# Patient Record
Sex: Female | Born: 1965 | Race: White | Hispanic: Yes | Marital: Married | Smoking: Never smoker
Health system: Southern US, Community
[De-identification: ages and names within clinical notes are randomized; demographics above are authoritative.]

## PROBLEM LIST (undated history)

## (undated) DIAGNOSIS — I10 Essential (primary) hypertension: Secondary | ICD-10-CM

## (undated) DIAGNOSIS — Z6791 Unspecified blood type, Rh negative: Secondary | ICD-10-CM

## (undated) DIAGNOSIS — E039 Hypothyroidism, unspecified: Secondary | ICD-10-CM

## (undated) DIAGNOSIS — E119 Type 2 diabetes mellitus without complications: Secondary | ICD-10-CM

## (undated) DIAGNOSIS — K219 Gastro-esophageal reflux disease without esophagitis: Secondary | ICD-10-CM

## (undated) DIAGNOSIS — E079 Disorder of thyroid, unspecified: Secondary | ICD-10-CM

## (undated) DIAGNOSIS — E7800 Pure hypercholesterolemia, unspecified: Secondary | ICD-10-CM

## (undated) DIAGNOSIS — B009 Herpesviral infection, unspecified: Secondary | ICD-10-CM

## (undated) HISTORY — PX: TUBAL LIGATION: SHX77

## (undated) HISTORY — DX: Herpesviral infection, unspecified: B00.9

## (undated) HISTORY — DX: Disorder of thyroid, unspecified: E07.9

## (undated) HISTORY — DX: Unspecified blood type, rh negative: Z67.91

## (undated) HISTORY — PX: DIAGNOSTIC LAPAROSCOPY: SUR761

---

## 2001-09-14 ENCOUNTER — Encounter: Admission: RE | Admit: 2001-09-14 | Discharge: 2001-09-14 | Payer: Self-pay | Admitting: Family Medicine

## 2001-09-23 ENCOUNTER — Encounter (INDEPENDENT_AMBULATORY_CARE_PROVIDER_SITE_OTHER): Payer: Self-pay | Admitting: *Deleted

## 2001-09-23 LAB — CONVERTED CEMR LAB

## 2001-09-24 ENCOUNTER — Encounter: Admission: RE | Admit: 2001-09-24 | Discharge: 2001-09-24 | Payer: Self-pay | Admitting: Family Medicine

## 2001-10-06 ENCOUNTER — Ambulatory Visit (HOSPITAL_COMMUNITY): Admission: RE | Admit: 2001-10-06 | Discharge: 2001-10-06 | Payer: Self-pay | Admitting: Family Medicine

## 2001-10-11 ENCOUNTER — Encounter: Admission: RE | Admit: 2001-10-11 | Discharge: 2001-10-11 | Payer: Self-pay | Admitting: Family Medicine

## 2001-10-25 ENCOUNTER — Encounter: Admission: RE | Admit: 2001-10-25 | Discharge: 2001-10-25 | Payer: Self-pay | Admitting: Family Medicine

## 2001-10-27 ENCOUNTER — Encounter: Admission: RE | Admit: 2001-10-27 | Discharge: 2001-10-27 | Payer: Self-pay | Admitting: Psychology

## 2001-11-11 ENCOUNTER — Encounter: Admission: RE | Admit: 2001-11-11 | Discharge: 2001-11-11 | Payer: Self-pay | Admitting: Family Medicine

## 2001-11-30 ENCOUNTER — Encounter: Admission: RE | Admit: 2001-11-30 | Discharge: 2001-11-30 | Payer: Self-pay | Admitting: Family Medicine

## 2001-12-06 ENCOUNTER — Encounter: Admission: RE | Admit: 2001-12-06 | Discharge: 2001-12-06 | Payer: Self-pay | Admitting: Family Medicine

## 2001-12-31 ENCOUNTER — Encounter: Admission: RE | Admit: 2001-12-31 | Discharge: 2001-12-31 | Payer: Self-pay | Admitting: Family Medicine

## 2002-01-18 ENCOUNTER — Encounter: Admission: RE | Admit: 2002-01-18 | Discharge: 2002-01-18 | Payer: Self-pay | Admitting: Family Medicine

## 2002-02-04 ENCOUNTER — Encounter: Admission: RE | Admit: 2002-02-04 | Discharge: 2002-02-04 | Payer: Self-pay | Admitting: Family Medicine

## 2002-02-11 ENCOUNTER — Encounter: Admission: RE | Admit: 2002-02-11 | Discharge: 2002-02-11 | Payer: Self-pay | Admitting: Family Medicine

## 2002-02-11 ENCOUNTER — Ambulatory Visit (HOSPITAL_COMMUNITY): Admission: RE | Admit: 2002-02-11 | Discharge: 2002-02-11 | Payer: Self-pay

## 2002-02-16 ENCOUNTER — Encounter: Admission: RE | Admit: 2002-02-16 | Discharge: 2002-02-16 | Payer: Self-pay | Admitting: Family Medicine

## 2002-02-18 ENCOUNTER — Encounter (HOSPITAL_COMMUNITY): Admission: RE | Admit: 2002-02-18 | Discharge: 2002-02-18 | Payer: Self-pay | Admitting: *Deleted

## 2002-02-22 ENCOUNTER — Encounter (INDEPENDENT_AMBULATORY_CARE_PROVIDER_SITE_OTHER): Payer: Self-pay | Admitting: Specialist

## 2002-02-22 ENCOUNTER — Inpatient Hospital Stay (HOSPITAL_COMMUNITY): Admission: AD | Admit: 2002-02-22 | Discharge: 2002-02-25 | Payer: Self-pay | Admitting: *Deleted

## 2002-04-14 ENCOUNTER — Encounter: Admission: RE | Admit: 2002-04-14 | Discharge: 2002-04-14 | Payer: Self-pay | Admitting: Family Medicine

## 2003-10-27 ENCOUNTER — Ambulatory Visit (HOSPITAL_COMMUNITY): Admission: RE | Admit: 2003-10-27 | Discharge: 2003-10-27 | Payer: Self-pay | Admitting: Family Medicine

## 2006-07-24 ENCOUNTER — Encounter (INDEPENDENT_AMBULATORY_CARE_PROVIDER_SITE_OTHER): Payer: Self-pay | Admitting: *Deleted

## 2007-09-07 ENCOUNTER — Ambulatory Visit (HOSPITAL_COMMUNITY): Admission: RE | Admit: 2007-09-07 | Discharge: 2007-09-07 | Payer: Self-pay | Admitting: Obstetrics & Gynecology

## 2008-03-22 ENCOUNTER — Ambulatory Visit: Payer: Self-pay | Admitting: Family Medicine

## 2008-03-28 ENCOUNTER — Ambulatory Visit: Payer: Self-pay | Admitting: *Deleted

## 2008-04-25 ENCOUNTER — Ambulatory Visit: Payer: Self-pay | Admitting: Internal Medicine

## 2008-04-25 ENCOUNTER — Encounter: Payer: Self-pay | Admitting: Family Medicine

## 2008-04-25 LAB — CONVERTED CEMR LAB
ALT: 22 units/L (ref 0–35)
AST: 22 units/L (ref 0–37)
Albumin: 3.6 g/dL (ref 3.5–5.2)
Alkaline Phosphatase: 132 units/L — ABNORMAL HIGH (ref 39–117)
BUN: 20 mg/dL (ref 6–23)
Basophils Absolute: 0 10*3/uL (ref 0.0–0.1)
Basophils Relative: 0 % (ref 0–1)
CO2: 21 meq/L (ref 19–32)
Calcium: 9.4 mg/dL (ref 8.4–10.5)
Chloride: 108 meq/L (ref 96–112)
Cholesterol: 133 mg/dL (ref 0–200)
Creatinine, Ser: 0.35 mg/dL — ABNORMAL LOW (ref 0.40–1.20)
Eosinophils Absolute: 0.1 10*3/uL (ref 0.0–0.7)
Eosinophils Relative: 1 % (ref 0–5)
Glucose, Bld: 88 mg/dL (ref 70–99)
HCT: 34.1 % — ABNORMAL LOW (ref 36.0–46.0)
HDL: 50 mg/dL (ref >39)
Hemoglobin: 11.9 g/dL — ABNORMAL LOW (ref 12.0–15.0)
LDL Cholesterol: 72 mg/dL (ref 0–99)
Lymphocytes Relative: 41 % (ref 12–46)
Lymphs Abs: 2.6 10*3/uL (ref 0.7–4.0)
MCHC: 34.9 g/dL (ref 30.0–36.0)
MCV: 83.4 fL (ref 78.0–100.0)
Monocytes Absolute: 0.6 10*3/uL (ref 0.1–1.0)
Monocytes Relative: 9 % (ref 3–12)
Neutro Abs: 3.1 10*3/uL (ref 1.7–7.7)
Neutrophils Relative %: 49 % (ref 43–77)
Platelets: 180 10*3/uL (ref 150–400)
Potassium: 3.7 meq/L (ref 3.5–5.3)
RBC: 4.09 M/uL (ref 3.87–5.11)
RDW: 13.5 % (ref 11.5–15.5)
Sodium: 140 meq/L (ref 135–145)
TSH: 0.007 microintl units/mL — ABNORMAL LOW (ref 0.350–4.50)
Total Bilirubin: 1.8 mg/dL — ABNORMAL HIGH (ref 0.3–1.2)
Total CHOL/HDL Ratio: 2.7
Total Protein: 6.3 g/dL (ref 6.0–8.3)
Triglycerides: 53 mg/dL (ref <150)
VLDL: 11 mg/dL (ref 0–40)
WBC: 6.3 10*3/uL (ref 4.0–10.5)

## 2008-05-01 ENCOUNTER — Ambulatory Visit: Payer: Self-pay | Admitting: Family Medicine

## 2008-05-01 ENCOUNTER — Encounter: Payer: Self-pay | Admitting: Family Medicine

## 2008-05-01 LAB — CONVERTED CEMR LAB
Chlamydia, DNA Probe: NEGATIVE
Free T4: 5.98 ng/dL — ABNORMAL HIGH (ref 0.89–1.80)
GC Probe Amp, Genital: NEGATIVE
TSH: 0.005 microintl units/mL — ABNORMAL LOW (ref 0.350–4.50)

## 2008-05-17 ENCOUNTER — Encounter: Payer: Self-pay | Admitting: Family Medicine

## 2008-05-17 ENCOUNTER — Ambulatory Visit: Payer: Self-pay | Admitting: Internal Medicine

## 2008-05-17 LAB — CONVERTED CEMR LAB
Free T4: 2.93 ng/dL — ABNORMAL HIGH (ref 0.89–1.80)
TSH: <0.004 microintl units/mL — ABNORMAL LOW (ref 0.350–4.50)

## 2008-05-29 ENCOUNTER — Encounter (HOSPITAL_COMMUNITY): Admission: RE | Admit: 2008-05-29 | Discharge: 2008-05-30 | Payer: Self-pay | Admitting: Family Medicine

## 2008-09-29 ENCOUNTER — Emergency Department (HOSPITAL_COMMUNITY): Admission: EM | Admit: 2008-09-29 | Discharge: 2008-09-29 | Payer: Self-pay | Admitting: Family Medicine

## 2008-11-16 ENCOUNTER — Ambulatory Visit: Payer: Self-pay | Admitting: Internal Medicine

## 2008-11-24 ENCOUNTER — Ambulatory Visit: Payer: Self-pay | Admitting: Internal Medicine

## 2009-01-04 ENCOUNTER — Ambulatory Visit: Payer: Self-pay | Admitting: Internal Medicine

## 2009-01-04 LAB — CONVERTED CEMR LAB
Free T4: 0.9 ng/dL (ref 0.80–1.80)
TSH: 4.854 microintl units/mL — ABNORMAL HIGH (ref 0.350–4.500)

## 2009-01-31 ENCOUNTER — Ambulatory Visit: Payer: Self-pay | Admitting: Internal Medicine

## 2009-02-01 ENCOUNTER — Encounter: Payer: Self-pay | Admitting: Family Medicine

## 2009-02-01 LAB — CONVERTED CEMR LAB
Cholesterol: 222 mg/dL — ABNORMAL HIGH (ref 0–200)
HDL: 64 mg/dL (ref >39)
LDL Cholesterol: 137 mg/dL — ABNORMAL HIGH (ref 0–99)
TSH: 4.697 microintl units/mL — ABNORMAL HIGH (ref 0.350–4.500)
Total CHOL/HDL Ratio: 3.5
Triglycerides: 105 mg/dL (ref <150)
VLDL: 21 mg/dL (ref 0–40)

## 2009-04-09 ENCOUNTER — Encounter: Payer: Self-pay | Admitting: Family Medicine

## 2009-04-09 ENCOUNTER — Ambulatory Visit: Payer: Self-pay | Admitting: Internal Medicine

## 2009-04-09 LAB — CONVERTED CEMR LAB: TSH: 0.217 microintl units/mL — ABNORMAL LOW (ref 0.350–4.500)

## 2009-04-16 ENCOUNTER — Ambulatory Visit: Payer: Self-pay | Admitting: Internal Medicine

## 2009-06-07 ENCOUNTER — Ambulatory Visit: Payer: Self-pay | Admitting: Internal Medicine

## 2009-06-07 LAB — CONVERTED CEMR LAB: TSH: 0.506 microintl units/mL (ref 0.350–4.500)

## 2009-06-15 ENCOUNTER — Ambulatory Visit: Payer: Self-pay | Admitting: Internal Medicine

## 2009-06-15 LAB — CONVERTED CEMR LAB: TSH: 0.493 microintl units/mL (ref 0.350–4.500)

## 2009-07-19 ENCOUNTER — Ambulatory Visit: Payer: Self-pay | Admitting: Internal Medicine

## 2009-07-19 LAB — CONVERTED CEMR LAB
Chlamydia, DNA Probe: NEGATIVE
GC Probe Amp, Genital: NEGATIVE
Pap Smear: NEGATIVE

## 2009-11-14 ENCOUNTER — Ambulatory Visit: Payer: Self-pay | Admitting: Internal Medicine

## 2009-11-14 LAB — CONVERTED CEMR LAB
BUN: 12 mg/dL (ref 6–23)
CO2: 23 meq/L (ref 19–32)
Calcium: 8.9 mg/dL (ref 8.4–10.5)
Chloride: 106 meq/L (ref 96–112)
Creatinine, Ser: 0.68 mg/dL (ref 0.40–1.20)
Glucose, Bld: 91 mg/dL (ref 70–99)
Magnesium: 1.7 mg/dL (ref 1.5–2.5)
Potassium: 4.1 meq/L (ref 3.5–5.3)
Sodium: 138 meq/L (ref 135–145)
TSH: 8.367 microintl units/mL — ABNORMAL HIGH (ref 0.350–4.500)

## 2010-01-21 ENCOUNTER — Ambulatory Visit: Payer: Self-pay | Admitting: Internal Medicine

## 2010-01-21 LAB — CONVERTED CEMR LAB: TSH: 9.836 microintl units/mL — ABNORMAL HIGH (ref 0.350–4.500)

## 2010-03-20 ENCOUNTER — Ambulatory Visit: Payer: Self-pay | Admitting: Family Medicine

## 2010-03-20 ENCOUNTER — Encounter: Payer: Self-pay | Admitting: Family Medicine

## 2010-03-20 LAB — CONVERTED CEMR LAB
Antibody Screen: NEGATIVE
Basophils Absolute: 0 10*3/uL (ref 0.0–0.1)
Basophils Relative: 0 % (ref 0–1)
Eosinophils Absolute: 0 10*3/uL (ref 0.0–0.7)
Eosinophils Relative: 0 % (ref 0–5)
HCT: 34.8 % — ABNORMAL LOW (ref 36.0–46.0)
HIV: NONREACTIVE
Hemoglobin: 11.6 g/dL — ABNORMAL LOW (ref 12.0–15.0)
Hepatitis B Surface Ag: NEGATIVE
Lymphocytes Relative: 10 % — ABNORMAL LOW (ref 12–46)
Lymphs Abs: 1.1 10*3/uL (ref 0.7–4.0)
MCHC: 33.3 g/dL (ref 30.0–36.0)
MCV: 83.5 fL (ref 78.0–100.0)
Monocytes Absolute: 0.5 10*3/uL (ref 0.1–1.0)
Monocytes Relative: 5 % (ref 3–12)
Neutro Abs: 8.9 10*3/uL — ABNORMAL HIGH (ref 1.7–7.7)
Neutrophils Relative %: 85 % — ABNORMAL HIGH (ref 43–77)
Platelets: 276 10*3/uL (ref 150–400)
RBC: 4.17 M/uL (ref 3.87–5.11)
RDW: 22.5 % — ABNORMAL HIGH (ref 11.5–15.5)
Rh Type: NEGATIVE
Rubella: 14.5 intl units/mL — ABNORMAL HIGH
Sickle Cell Screen: NEGATIVE
WBC: 10.5 10*3/uL (ref 4.0–10.5)

## 2010-03-28 ENCOUNTER — Ambulatory Visit: Payer: Self-pay | Admitting: Family Medicine

## 2010-03-28 ENCOUNTER — Encounter: Payer: Self-pay | Admitting: Family Medicine

## 2010-03-28 DIAGNOSIS — A6000 Herpesviral infection of urogenital system, unspecified: Secondary | ICD-10-CM | POA: Insufficient documentation

## 2010-03-28 DIAGNOSIS — E039 Hypothyroidism, unspecified: Secondary | ICD-10-CM | POA: Insufficient documentation

## 2010-03-28 LAB — CONVERTED CEMR LAB
Glucose, Urine, Semiquant: NEGATIVE
Nitrite: NEGATIVE
Protein, U semiquant: NEGATIVE

## 2010-04-25 ENCOUNTER — Telehealth: Payer: Self-pay | Admitting: *Deleted

## 2010-05-09 ENCOUNTER — Encounter: Payer: Self-pay | Admitting: Family Medicine

## 2010-05-09 ENCOUNTER — Ambulatory Visit: Payer: Self-pay | Admitting: Obstetrics & Gynecology

## 2010-05-09 ENCOUNTER — Encounter (INDEPENDENT_AMBULATORY_CARE_PROVIDER_SITE_OTHER): Payer: Self-pay | Admitting: *Deleted

## 2010-05-09 LAB — CONVERTED CEMR LAB
Chlamydia, DNA Probe: NEGATIVE
GC Probe Amp, Genital: NEGATIVE

## 2010-05-10 ENCOUNTER — Ambulatory Visit (HOSPITAL_COMMUNITY)
Admission: RE | Admit: 2010-05-10 | Discharge: 2010-05-10 | Payer: Self-pay | Source: Home / Self Care | Attending: Family Medicine | Admitting: Family Medicine

## 2010-05-23 ENCOUNTER — Encounter: Payer: Self-pay | Admitting: Family Medicine

## 2010-05-23 ENCOUNTER — Ambulatory Visit: Payer: Self-pay | Admitting: Family Medicine

## 2010-05-23 LAB — CONVERTED CEMR LAB: TSH: 9.597 microintl units/mL — ABNORMAL HIGH (ref 0.350–4.500)

## 2010-05-28 ENCOUNTER — Telehealth: Payer: Self-pay | Admitting: Family Medicine

## 2010-06-06 ENCOUNTER — Encounter: Payer: Self-pay | Admitting: Family Medicine

## 2010-06-06 ENCOUNTER — Ambulatory Visit: Admission: RE | Admit: 2010-06-06 | Discharge: 2010-06-06 | Payer: Self-pay | Source: Home / Self Care

## 2010-06-06 LAB — CONVERTED CEMR LAB
Antibody Screen: NEGATIVE
HCT: 31.9 % — ABNORMAL LOW (ref 36.0–46.0)
HIV: NONREACTIVE
Hemoglobin: 11.1 g/dL — ABNORMAL LOW (ref 12.0–15.0)
MCHC: 34.8 g/dL (ref 30.0–36.0)
MCV: 95.8 fL (ref 78.0–100.0)
Platelets: 224 10*3/uL (ref 150–400)
RBC: 3.33 M/uL — ABNORMAL LOW (ref 3.87–5.11)
RDW: 14.9 % (ref 11.5–15.5)
WBC: 8.6 10*3/uL (ref 4.0–10.5)

## 2010-06-07 ENCOUNTER — Ambulatory Visit
Admission: RE | Admit: 2010-06-07 | Discharge: 2010-06-07 | Payer: Self-pay | Source: Home / Self Care | Attending: Family Medicine | Admitting: Family Medicine

## 2010-06-07 ENCOUNTER — Encounter: Payer: Self-pay | Admitting: Family Medicine

## 2010-06-10 LAB — GLUCOSE, CAPILLARY
Glucose-Capillary: 138 mg/dL — ABNORMAL HIGH (ref 70–99)
Glucose-Capillary: 90 mg/dL (ref 70–99)

## 2010-06-19 ENCOUNTER — Encounter: Payer: Self-pay | Admitting: Family Medicine

## 2010-06-19 ENCOUNTER — Ambulatory Visit: Admission: RE | Admit: 2010-06-19 | Discharge: 2010-06-19 | Payer: Self-pay | Source: Home / Self Care

## 2010-06-19 LAB — CONVERTED CEMR LAB: TSH: 4.084 microintl units/mL (ref 0.350–4.500)

## 2010-06-25 NOTE — Assessment & Plan Note (Signed)
Summary: NOB/AAM/DSL   Vital Signs:  Patient profile:   45 year old female LMP:     11/24/2009 Weight:      178 pounds Temp:     98.1 degrees F oral Pulse rate:   68 / minute BP sitting:   145 / 80  (left arm) Cuff size:   regular  Vitals Entered By: Jimmy Footman, CMA (March 28, 2010 3:06 PM) CC: OB  Is Patient Diabetic? No Pain Assessment Patient in pain? no      LMP (date): 11/24/2009     Enter LMP: 11/24/2009 Last PAP Result NEGATIVE FOR INTRAEPITHELIAL LESIONS OR MALIGNANCY.   CC:  OB .  History of Present Illness: Pt. has no current complaints, this is her 6th pregnancy, however the last one was 8 years ago.  Her LMP was7/06/2009, making her [facility] 08/30/2009.   Pt. is concerned about being on medication while pregnant, as she has been diagnosed with graves disease, and is not on synthroid since her last pregnancy.  She has also been diagnosed with HSV and takes valtrex as needed for outbreaks.  She denies any current symptoms.   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 One By Mouth Daily. 2)  Gnp Prenatal Vitamins 28-0.8 Mg Tabs (Prenatal Vit-Fe Fumarate-Fa) .... One By Mouth Daily. 3)  Valtrex 500 Mg Tabs (Valacyclovir Hcl) .... One By Mouth Two Times A Day For Outbreaks.  Allergies (verified): No Known Drug Allergies  Past History:  Past Medical History: Last updated: 07/23/2006 advanced maternal age / RH negative  Social History: Smoking Status:  never Packs/Day:  n/a  Physical Exam  General:  Well-developed,well-nourished,in no acute distress; alert,appropriate and cooperative  throughout examination Head:  Normocephalic and atraumatic without obvious abnormalities. No apparent alopecia or balding. Eyes:  No corneal or conjunctival inflammation noted. EOMI. Perrla. Mouth:  Oral mucosa and oropharynx without lesions or exudates.   Lungs:  Normal respiratory effort, chest expands symmetrically. Lungs are clear to auscultation, no crackles or wheezes. Heart:  Normal rate and regular rhythm. S1 and S2 normal without gallop, murmur, click, rub or other extra sounds. Abdomen:  Gravid. Fundal height 18 cm.  Genitalia:  Normal introitus for age, no external lesions, no vaginal discharge, mucosa pink and moist, no vaginal or cervical lesions, no vaginal atrophy, no friaility or hemorrhage. Pulses:  R and L carotid,radial,dorsalis pedis and posterior tibial pulses are full and equal bilaterally Extremities:  No clubbing, cyanosis, edema, or deformity noted with normal full range of motion of all joints.   Skin:  Intact without suspicious lesions or rashes   Impression & Recommendations:  Problem # 1:  SUPERVISION OF OTHER NORMAL PREGNANCY (ICD-V22.1) Appears to be normal pregnacy.  However, due to hypothyroidism and AMA, will refer to High-Risk clinic for evaluation.  Orders: Other OB visit- [facility] (OBCK)  Problem # 2:  ADVANCED MATERNAL AGE (ICD-659.60) Pt. will need to be evaluated by High-Risk OB clinic due to [facility].  Orders: Obstetric Referral (Obstetric) Other OB visit- [facility] (OBCK)  Problem # 3:  UNSPECIFIED HYPOTHYROIDISM (ICD-244.9) [facility]  She seems to have poor understanding/compliance  with medication changes.  Her updated medication list for this problem includes:    Levothroid 88 Mcg Tabs (Levothyroxine sodium) ..... One by mouth daily.  Orders: Obstetric Referral (Obstetric) Other OB visit- [facility] (OBCK)  Problem # 4:  UNSPECIFIED GENITAL HERPES (ICD-054.10) No current lesions.  Discussed with pt. that she would likely  need to take medication for this after 36 weeks, but would be allowed to have a vaginal delivery as long as she has no active lesions.  Orders: Obstetric Referral (Obstetric) Other OB visit- [facility] (OBCK)  Complete Medication List: 1)  Levothroid 88 Mcg Tabs (Levothyroxine sodium) .... One by mouth daily. 2)  Gnp Prenatal Vitamins 28-0.8 Mg Tabs (Prenatal vit-fe fumarate-fa) .... One by mouth daily. 3)  Valtrex 500 Mg Tabs (Valacyclovir hcl) .... One by mouth two times a day for outbreaks.  Patient Instructions: 1)  It was nice to meet you.   2)  Please keep taking your prenatal vitamin and thyroid medication.  3)  I am referring you to the High-risk pregnancy clinic at [facility], they will call you to schedule an appointment.    Orders Added: 1)  Obstetric Referral [Obstetric] 2)  Other OB visit- [facility] [OBCK]     Flowsheet View for Follow-up Visit    Weight:     178    Blood pressure:   145 / 80    Urine protein:       N    Urine glucose:    N    Urine nitrite:     N    Hx headache?     No    Nausea/vomiting?   No    Edema?     0    Bleeding?     no    Leakage/discharge?   no    Fetal activity:       N/A    Labor symptoms?   no    Fundal height:      18cm    FHR:       150    Taking Vitamins?   Y    Smoking PPD:   n/a   Past Pregnancy History    Gravida:     6    Term Births:     5    Premature Births:   0    Living Children:   5    Para:       5    Mult. Births:     0    Prev C-Section:   0    Prev. VBAC attempt?   none    Aborta:     0    Elect. Ab:     0    Spont. Ab:     0    Ectopics:     0

## 2010-06-25 NOTE — Miscellaneous (Signed)
  Clinical Lists Changes  Problems: Added new problem of UNSPECIFIED HYPOTHYROIDISM (ICD-244.9) Medications: Added new medication of * LEVOTHYROXIN unknown dosage Observations: Added new observation of REGULAREXERC: yes (03/28/2010 12:54) Added new observation of TRANSPORTACC: Public Transportation (03/28/2010 12:54) Added new observation of PT OCCUPAT: None (03/28/2010 12:54) Added new observation of ALCOHOLHX: None (03/28/2010 12:54) Added new observation of EDUCA LEVEL: 5-8 yrs (03/28/2010 12:54) Added new observation of ETHNICITY: Hispanic/Latino White (03/28/2010 12:54) Added new observation of DRUG USE: never (03/28/2010 12:54) Added new observation of SH SEXUAL: currently monogamous (03/28/2010 12:54)         Social History: Sexual History:  currently monogamous Drug Use:  never Ethnicity:  Hispanic/Latino White Education:  5-8 yrs Alcohol:  None Occupation:  None Transportation:  Therapist, music Does Patient Exercise:  yes

## 2010-06-26 ENCOUNTER — Encounter: Payer: Self-pay | Admitting: Family Medicine

## 2010-06-26 DIAGNOSIS — O26899 Other specified pregnancy related conditions, unspecified trimester: Secondary | ICD-10-CM | POA: Insufficient documentation

## 2010-06-26 DIAGNOSIS — Z6791 Unspecified blood type, Rh negative: Secondary | ICD-10-CM | POA: Insufficient documentation

## 2010-06-26 HISTORY — DX: Unspecified blood type, rh negative: Z67.91

## 2010-06-27 ENCOUNTER — Other Ambulatory Visit: Payer: Self-pay | Admitting: Family Medicine

## 2010-06-27 DIAGNOSIS — D259 Leiomyoma of uterus, unspecified: Secondary | ICD-10-CM

## 2010-06-27 NOTE — Assessment & Plan Note (Addendum)
Summary: Jamie [name]   Vital Signs:  Patient profile:   45 year old female Weight:      185 pounds BP sitting:   109 / 69  Primary Care Provider:  Ardyth Gal  CC:  follow pregnancy .  History of Present Illness: 1) Hypothryoid: taking new dose levothyroxine as prescribed w/o negative side effects.   Habits & Providers  Alcohol-Tobacco-Diet     Cigarette Packs/Day: n/a  Allergies: No Known Drug Allergies   Impression & Recommendations:  Problem # 1:  SUPERVISION OF OTHER NORMAL PREGNANCY (ICD-V22.1) Assessment Comment Only Doing well today, however overall concerned about her uncontrolled hypothyroidism during this pregnancy (see assessment below). Rhogam given today for Rh negative. Checking 1 hr GTT today - ELEVATED - will have 3 hr GTT tomorrow. Recheck CBC, RPR, HIV.  Follow up with PCP in 2 weeks. Advanced materanal age - did not get a chance to discuss referral to MFM - would have PCP do this at next appointment.    Orders: CBC-[facility] (16109) HIV-[facility] (60454-09811) Glucose 1 hr-[facility] (91478) RPR-[facility] (29562-13086) Miscellaneous Lab Charge-[facility] (57846) Rhogam 300 mcg - IM (J2790) Other OB visit- [facility] (OBCK)  Problem # 2:  UNSPECIFIED HYPOTHYROIDISM (ICD-244.9) Assessment: Unchanged  Now on increased dose - states that she is taking. Would follow up in 4 weeks for repeat TSH.   Her updated medication list for this problem includes:    Levothyroxine Sodium 150 Mcg Tabs (Levothyroxine sodium) ..... Sig: take 1 tab by mouth one time daily spanish-language instructions  Orders: Other OB visit- [facility] (OBCK)  Complete Medication List: 1)  Gnp Prenatal Vitamins 28-0.8 Mg Tabs (Prenatal vit-fe fumarate-fa) .... One by mouth daily. 2)  Valtrex 500 Mg Tabs (Valacyclovir hcl) .... One by mouth two times a day for outbreaks. 3)  Levothyroxine Sodium 150 Mcg Tabs (Levothyroxine sodium) .... Sig: take 1 tab by mouth one time daily spanish-language  instructions  Patient Instructions: 1)  Haga una cita con Dr. Lula Olszewski para dos semanas    Medication Administration  Injection # 1:    Medication: Rhogam 300 mcg - IM    Diagnosis: SUPERVISION OF OTHER NORMAL PREGNANCY (ICD-V22.1)    Route: IM    Site: RUOQ gluteus    Exp Date: 05/11/2012    Lot #: [id number]    Mfr: csl behring AG    Patient tolerated injection without complications    Given by: Jone Baseman CMA (June 06, 2010 12:11 PM)  Orders Added: 1)  CBC-[facility] [85027] 2)  HIV-[facility] [24401-02725] 3)  Glucose 1 hr-[facility] [82950] 4)  RPR-[facility] [36644-03474] 5)  Miscellaneous Lab Charge-[facility] [99999] 6)  Rhogam 300 mcg - IM [J2790] 7)  Other OB visit- [facility] [OBCK]      Flowsheet View for Follow-up Visit    Estimated weeks of       gestation:     27 5/7    Weight:     185    Blood pressure:   109 / 69    Headache:     No    Nausea/vomiting:   No    Edema:     0    Vaginal bleeding:   no    Vaginal discharge:   no    Fundal height:      27    FHR:       140s    Fetal activity:     yes    Labor symptoms:   no    Smoking:  n/a    Next visit:     2 wk    Resident:     Wallene Huh     Medication Administration  Injection # 1:    Medication: Rhogam 300 mcg - IM    Diagnosis: SUPERVISION OF OTHER NORMAL PREGNANCY (ICD-V22.1)    Route: IM    Site: RUOQ gluteus    Exp Date: 05/11/2012    Lot #: [id number]    Mfr: csl behring AG    Patient tolerated injection without complications    Given by: Jone Baseman CMA (June 06, 2010 12:11 PM)  Orders Added: 1)  CBC-[facility] [85027] 2)  HIV-[facility] [09811-91478] 3)  Glucose 1 hr-[facility] [82950] 4)  RPR-[facility] [29562-13086] 5)  Miscellaneous Lab Charge-[facility] [99999] 6)  Rhogam 300 mcg - IM [J2790] 7)  Other OB visit- [facility] [OBCK]

## 2010-06-27 NOTE — Progress Notes (Signed)
  Phone Note Outgoing Call Call back at [facility] Phone [phone]   Call placed by: Paula Compton MD,  May 28, 2010 9:47 AM Call placed to: Patient Action Taken: Phone Call Completed Summary of Call: Called patient, conversation in [place].  She reports that she has been taking her LT4 daily throughout this pregnancy. Her TSH has been elevated since before coming to pregnancy care for this pregnancy.  She reports having gone to [facility] clinic once during this pregnancy.  Given that pregnant patients with established hypothyroidism routinely require significant (50%) increase in their LT4 dosing when controlled, I will increase to daily.  To recheck TSH in 4 weeks.  Would consider level II ultrasound in light of pre-existent and poorly controlled hypothyroidism. Initial call taken by: Paula Compton MD,  May 28, 2010 10:02 AM    New/Updated Medications: LEVOTHYROXINE SODIUM 150 MCG TABS (LEVOTHYROXINE SODIUM) SIG: Take 1 tab by mouth one time daily Spanish-language instructions Prescriptions: LEVOTHYROXINE SODIUM 150 MCG TABS (LEVOTHYROXINE SODIUM) SIG: Take 1 tab by mouth one time daily Spanish-language instructions  #30 x 6   Entered and Authorized by:   Paula Compton MD   Signed by:   Paula Compton MD on 05/28/2010   Method used:   Electronically to        [facility] [phone]* (retail)       [address]       [place], [place]  [address]       Ph: [phone]       Fax: [phone]   RxID:   [id number]

## 2010-06-27 NOTE — Miscellaneous (Signed)
Summary: Rhogam  Rhogam   Imported By: De Nurse 06/06/2010 16:45:21  _____________________________________________________________________  External Attachment:    Type:   Image     Comment:   External Document

## 2010-06-27 NOTE — Assessment & Plan Note (Signed)
Summary: ob f/u  Prenatal Visit Concerns noted: Pt. states that she has a history of genital herpes, and recently noticed a lesion in her genital area.  No other concerns.   Pt. states she is taking her prenatal vitamin and her thyroid medication.  [facility].  EDC Confirmation:    New working [facility]: 08/31/2010    EDC by LMP: 08/31/2010  Flowsheet View for Follow-up Visit    Estimated weeks of       gestation:     25 5/7    Weight:     182.1    Blood pressure:   104 / 69    Hx headache?     No    Nausea/vomiting?   No    Edema?     0    Bleeding?     no    Leakage/discharge?   no    Fetal activity:       yes    Labor symptoms?   no    Fundal height:      26cm    FHR:       150    Taking Vitamins?   Y    Smoking PPD:   n/a    Next visit:     2 wk    Resident:     chamberlain Prescriptions: VALTREX 500 MG TABS (VALACYCLOVIR HCL) one by mouth two times a day for outbreaks.  #30 x 2   Entered and Authorized by:   Ardyth Gal MD   Signed by:   Ardyth Gal MD on 05/23/2010   Method used:   Print then Give to Patient   RxID:   [id number] GNP PRENATAL VITAMINS 28-0.8 MG TABS (PRENATAL VIT-FE FUMARATE-FA) one by mouth daily.  #30 x 3   Entered and Authorized by:   Ardyth Gal MD   Signed by:   Ardyth Gal MD on 05/23/2010   Method used:   Print then Give to Patient   RxID:   [id number] LEVOTHROID 88 MCG TABS (LEVOTHYROXINE SODIUM) one by mouth daily.  #30 x 3   Entered and Authorized by:   Ardyth Gal MD   Signed by:   Ardyth Gal MD on 05/23/2010   Method used:   Print then Give to Patient   RxID:   [id number]   Physical Examination  Vital Signs:  P: 91  BP (upright): 104/69  Wt: 182.1    General Exam:  Constitutional:    alert, no acute distress, well hydrated, well developed, well nourished, and appropriate dress.   Eyes:    EOM intact and no injection.     Nose:    normal mucosa.   Mouth:    no erythema, no exudates, and no lesions.   Cardiovascular:    RRR, no murmurs, no gallops, peripheral pulses intact, and no edema.   Respiratory:    clear to auscultation.   Abdomen:    gravid and nontender.    Impression & Recommendations:  Problem # 1:  SUPERVISION OF OTHER NORMAL PREGNANCY (ICD-V22.1)  Pt doing well- BP is good and 1 hour glucola was normal.  Pt. was referred to [facility] clinic 2/2 below.  I contacted them today and per nurse, pt. was seen by Dr. Debroah Loop, who did not feel she needed to be seen in high risk and pt wished to continue care here at [facility].  Will defer to Drs Mauricio Po and Thomasene Lot to decide if this  is appropriate.   Orders: Other OB visit- [facility] (OBCK)  Problem # 2:  ADVANCED MATERNAL AGE (ICD-659.60) Pt with no complications thus far, will continue monitoring.   Orders: Other OB visit- [facility] (OBCK)  Problem # 3:  UNSPECIFIED GENITAL HERPES (ICD-054.10)  Will advise 500 mg Valtrex two times a day for three days.  She already has a perscription.  I have explained that she will take a propylactic medicine when she gets to 36 weeks, and that if she has no active lesions she will be able to have a vaginal delivery, but if not she may need a c-section.   Orders: Other OB visit- [facility] (OBCK)  Problem # 4:  UNSPECIFIED HYPOTHYROIDISM (ICD-244.9)  Per pt. report no changes done at [facility].  Will try to obtain records of that visit.  I will check TSH to see if medication changes are necessary.  Her updated medication list for this problem includes:    Levothroid 88 Mcg Tabs (Levothyroxine sodium) ..... One by mouth daily.  Orders: TSH-[facility] (10272-53664) Other OB visit- [facility] (OBCK)  Complete Medication List: 1)  Levothroid 88 Mcg Tabs (Levothyroxine sodium) .... One by mouth daily. 2)  Gnp Prenatal Vitamins 28-0.8 Mg Tabs (Prenatal vit-fe fumarate-fa) .... One by mouth daily. 3)  Valtrex 500 Mg Tabs (Valacyclovir hcl) .... One by mouth  two times a day for outbreaks.  Flowsheet View for Follow-up Visit    Estimated weeks of       gestation:     25 5/7    Weight:     182.1    Blood pressure:   104 / 69    Headache:     No    Nausea/vomiting:   No    Edema:     0    Vaginal bleeding:   no    Vaginal discharge:   no    Fundal height:      26cm    FHR:       150    Fetal activity:     yes    Labor symptoms:   no    Taking prenatal vits?   Y    Smoking:     n/a    Next visit:     2 wk    Resident:     chamberlain   Vital Signs:  Patient profile:   45 year old female Weight:      182.1 pounds Pulse rate:   91 / minute BP sitting:   104 / 69  Vitals Entered By: Garen Grams LPN (May 23, 2010 10:00 AM)   Genetic History     Thalassemia:     mother: no    Neural tube defect:   mother: no    Down's Syndrome:   mother: no    Tay-Sachs:     mother: no    Sickle Cell Dz/Trait:   mother: no    Hemophilia:     mother: no    Muscular Dystrophy:   mother: no    Cystic Fibrosis:   mother: no    Huntington's Dz:   mother: no    Mental Retardation:   mother: no    Fragile X:     mother: no    Other Genetic or       Chromosomal Dz:   mother: no    Child with other       birth defect:     mother: no    > 3  spont. abortions:   mother: no    Hx of stillbirth:     mother: no  Infection Risk History    High Risk Hepatitis B: no    Immunized against Hepatitis B: yes    Exposure to TB: no    Patient with history of Genital Herpes: yes    Sexual partner with history of Genital Herpes: yes    History of STD (GC, Chlamydia, Syphilis, HPV): no    Rash, Viral, or Febrile Illness since LMP: no    Exposure to Cat Litter: no    Chicken Pox Immune Status: Hx of Disease: Immune    Occupational Exposure to Children: none  Environmental Exposures    Xray Exposure since LMP: no    Chemical or other exposure: no    Medication, drug, or alcohol use since LMP: no   Appended Document: ob f/u Needs antibody screen and  Rhogam next visit.

## 2010-06-27 NOTE — Assessment & Plan Note (Signed)
Summary: F/U  PER MD/RH   Vital Signs:  Patient profile:   45 year old female Weight:      185.1 pounds Pulse rate:   93 / minute BP sitting:   109 / 68  Vitals Entered By: Garen Grams LPN (June 19, 2010 1:38 PM)  Allergies: No Known Drug Allergies  Physical Exam  General:  Well-developed,well-nourished,in no acute distress; alert,appropriate and cooperative throughout examination Mouth:  Oral mucosa and oropharynx without lesions or exudates.  Teeth in good repair. Neck:  No deformities, masses, or tenderness noted. Lungs:  Normal respiratory effort, chest expands symmetrically. Lungs are clear to auscultation, no crackles or wheezes. Heart:  Normal rate and regular rhythm. S1 and S2 normal without gallop, murmur, click, rub or other extra sounds. Abdomen:  Gravid, non-tender.  Pulses:  2+ extremity pulses.  Extremities:  no edema.    Impression & Recommendations:  Problem # 1:  SUPERVISION OF OTHER NORMAL PREGNANCY (ICD-V22.1) Pt. overall doing well.  Will have her be seen in OB clinic next time (2 weeks), and follow up with me again in 4 weeks.  Orders: TSH-[facility] ([id number]) Other OB visit- [facility] (OBCK)  Problem # 2:  UNSPECIFIED HYPOTHYROIDISM (ICD-244.9) Will check TSH today and make any necessary Medication changes, goal TSH less than 3.  Her updated medication list for this problem includes:    Levothyroxine Sodium 150 Mcg Tabs (Levothyroxine sodium) ..... Sig: take 1 tab by mouth one time daily spanish-language instructions  Orders: TSH-[facility] ([id number]) Other OB visit- [facility] (OBCK)  Problem # 3:  ADVANCED MATERNAL AGE (ICD-659.60) Pt. has declined any kind of genetic testing, she understands there is a greater risk of trisomy with AMA.  Orders: Other OB visit- [facility] (OBCK)  Problem # 4:  UNSPECIFIED GENITAL HERPES (ICD-054.10) Pt. denies active lesions, understands will take valtrex prophylactically starting at 36 weeks.  Orders: Other OB visit- [facility]  (OBCK)  Complete Medication List: 1)  Gnp Prenatal Vitamins 28-0.8 Mg Tabs (Prenatal vit-fe fumarate-fa) .... One by mouth daily. 2)  Valtrex 500 Mg Tabs (Valacyclovir hcl) .... One by mouth two times a day for outbreaks. 3)  Levothyroxine Sodium 150 Mcg Tabs (Levothyroxine sodium) .... Sig: take 1 tab by mouth one time daily spanish-language instructions  Patient Instructions: 1)  It was good to see you.  I am going to check your thyroid level to make sure you are on the right medication dose. 2)  I have refilled your Valtrex.  Right now you only need to take it if you have symptoms.  When you reach 36 weeks pregnancy, you will take it every day twice a day til you deliver. 3)  I want you to make an appointment for 2 weeks to be seen here in the [facility] clinic.  Prescriptions: VALTREX 500 MG TABS (VALACYCLOVIR HCL) one by mouth two times a day for outbreaks.  #30 x 2   Entered and Authorized by:   Ardyth Gal MD   Signed by:   Ardyth Gal MD on 06/19/2010   Method used:   Print then Give to Patient   RxID:   [id number]    Orders Added: 1)  TSH-[facility] [[id number]] 2)  Other OB visit- [facility] [OBCK]     OB Initial Intake Information    Positive HCG by: self    Race: White    Marital status: Married    Type of work: None    Education (last grade completed): 5-8 yrs  Menstrual History  LMP (date): 11/24/2009    EDC by LMP: 08/31/2010    LMP - Reliable? : YES   Flowsheet View for Follow-up Visit    Estimated weeks of       gestation:     29 4/7    Weight:     185.1    Blood pressure:   109 / 68    Headache:     No    Nausea/vomiting:   No    Edema:     0    Vaginal bleeding:   no    Vaginal discharge:   no    Fundal height:      29    FHR:       135    Fetal activity:     yes    Labor symptoms:   no    Taking prenatal vits?   Y    Smoking:     n/a    Next visit:     2 wk    Resident:     Lula Olszewski    Preceptor:     Mauricio Po   Past  Pregnancy History    Gravida:     6    Term Births:     5    Premature Births:   0    Living Children:   5    Para:       5    Mult. Births:     0    Prev C-Section:   0    Prev. VBAC attempt?   none    Aborta:     0    Elect. Ab:     0    Spont. Ab:     0    Ectopics:     0  Pregnancy # 1    Delivery date:     02/04/1984    Weeks Gestation:   term    Preterm labor:     no    Delivery type:     NSVD    Hours of labor:     52    Anesthesia type:     none    Infant Sex:     Female    Birth weight:     pt does not remember  Pregnancy # 2    Delivery date:     08/27/1989    Weeks Gestation:   term    Preterm labor:     no    Delivery type:     NSVD    Hours of labor:     3-4 hours    Anesthesia type:     none    Infant Sex:     Female    Birth weight:     about 7  lbs  Pregnancy # 3    Delivery date:     02/06/1992    Weeks Gestation:   term    Preterm labor:     no    Delivery type:     NSVD    Hours of labor:     3-4    Anesthesia type:     none    Infant Sex:     Female    Birth weight:     about 7 lbs  Pregnancy # 4    Delivery date:     02/04/1998    Weeks Gestation:   term    Preterm labor:     no  Delivery type:     NSVD    Hours of labor:     4    Anesthesia type:     none    Infant Sex:     Female    Birth weight:     about 7 lbs  Pregnancy # 5    Delivery date:     02/23/2002    Weeks Gestation:   term    Preterm labor:     no    Delivery type:     NSVD    Hours of labor:     3    Anesthesia type:     none    Delivery location:     [facility]    Infant Sex:     Female    Birth weight:     8 lbs 14 oz   Genetic History    Genetic History Reviewed:    Maternal age:     46    Thalassemia:     mother: no    Neural tube defect:   mother: no    Down's Syndrome:   mother: no    Tay-Sachs:     mother: no    Sickle Cell Dz/Trait:   mother: no    Hemophilia:     mother: no    Muscular Dystrophy:   mother: no    Cystic Fibrosis:   mother: no     Huntington's Dz:   mother: no    Mental Retardation:   mother: no    Fragile X:     mother: no    Other Genetic or       Chromosomal Dz:   mother: no    Child with other       birth defect:     mother: no    > 3 spont. abortions:   mother: no    Hx of stillbirth:     mother: no  Additional Genetic Comments:    Pt. has declined any genetic testing, amniocentesis, or MFM referral.  Prenatal Visit Concerns noted: Pt. has no concerns today.  Says baby is very active.  EDC Confirmation:    LMP reliable? YES    EDC by LMP: 08/31/2010 Ultrasound Dating Information:    First U/S on 05/10/2010   Gest age: 5.4   EDC: 09/02/2010.

## 2010-06-27 NOTE — Progress Notes (Signed)
Summary: referral  Phone Note Call from Patient   Caller: Patient Summary of Call: Pt called to know the status of her referral [facility]). Initial call taken by: Marines Jean Rosenthal,  April 25, 2010 11:38 AM  Follow-up for Phone Call        Dr. Lula Olszewski,       Did you flag Huntley Dec on this?  If not, do you still want it? Follow-up by: Jone Baseman CMA,  April 25, 2010 11:58 AM  Additional Follow-up for Phone Call Additional follow up Details #1::        I am not sure if I flaged her- Yes, she needs to be seen at high risk clinic at [facility].    Thanks Additional Follow-up by: Ardyth Gal MD,  April 25, 2010 1:43 PM    Additional Follow-up for Phone Call Additional follow up Details #2::    Actually does this pt have debra hill or has she applied for it?  We can refer her to [facility] but she will be sent a bill.  Marines can you find this out? Follow-up by: Jone Baseman CMA,  April 25, 2010 2:38 PM  Additional Follow-up for Phone Call Additional follow up Details #3:: Details for Additional Follow-up Action Taken: pt came in the office trying to find out info on this appt, [facility]. Gave pt deb hill info and told her to apply & bring [place] a copy of the card if she was approved. pt agreed & told pt we could call her for the appt.  Additional Follow-up by: Knox Royalty,  May 01, 2010 3:42 PM  Pt called one more time for appt at [facility], I contact pt and she will appl x Debora Hill's card tomorrow 05/03/2010. Pt will bring [place]  the [facility] Card if she is approved.  Marines Jean Rosenthal   pt was approved for orange card, see emr. pt would like a call asap with appt, .Knox Royalty  May 03, 2010 4:21 PM   See order. ............................................... Shanda Bumps [facility] May 06, 2010 11:55 AM

## 2010-06-27 NOTE — Consult Note (Signed)
Summary: Prenatal Care at [facility]   Imported By: Bradly Bienenstock 06/04/2010 11:44:40  _____________________________________________________________________  External Attachment:    Type:   Image     Comment:   External Document

## 2010-07-01 ENCOUNTER — Telehealth: Payer: Self-pay | Admitting: Family Medicine

## 2010-07-02 NOTE — Progress Notes (Signed)
  Subjective:    Patient ID: Jamie [name], female    DOB: 1965/09/27, 45 y.o.   MRN: [id number]  HPI    Review of Systems     Objective:   Physical Exam        Assessment & Plan:

## 2010-07-03 DIAGNOSIS — Z3490 Encounter for supervision of normal pregnancy, unspecified, unspecified trimester: Secondary | ICD-10-CM | POA: Insufficient documentation

## 2010-07-10 ENCOUNTER — Other Ambulatory Visit: Payer: Self-pay | Admitting: Family Medicine

## 2010-07-10 DIAGNOSIS — E039 Hypothyroidism, unspecified: Secondary | ICD-10-CM

## 2010-07-11 ENCOUNTER — Ambulatory Visit (INDEPENDENT_AMBULATORY_CARE_PROVIDER_SITE_OTHER): Payer: Self-pay | Admitting: Family Medicine

## 2010-07-11 DIAGNOSIS — A6000 Herpesviral infection of urogenital system, unspecified: Secondary | ICD-10-CM

## 2010-07-11 DIAGNOSIS — E039 Hypothyroidism, unspecified: Secondary | ICD-10-CM

## 2010-07-11 DIAGNOSIS — O360990 Maternal care for other rhesus isoimmunization, unspecified trimester, not applicable or unspecified: Secondary | ICD-10-CM

## 2010-07-11 DIAGNOSIS — Z6791 Unspecified blood type, Rh negative: Secondary | ICD-10-CM

## 2010-07-11 DIAGNOSIS — Z3490 Encounter for supervision of normal pregnancy, unspecified, unspecified trimester: Secondary | ICD-10-CM

## 2010-07-11 DIAGNOSIS — Z3480 Encounter for supervision of other normal pregnancy, unspecified trimester: Secondary | ICD-10-CM

## 2010-07-11 LAB — TSH: TSH: 2.946 u[IU]/mL (ref 0.350–4.500)

## 2010-07-11 NOTE — Progress Notes (Signed)
Subjective:    Jamie [name] is a 45 y.o. female being seen today for her obstetrical visit. She is at [redacted]w[redacted]d gestation. Patient reports has had some mild sore throat  since moving into a filthy apartment recently.  Is currently trying to change her living situation to a trailer, is in dispute with the landlord who showed her one apartment but rented her a different one in the same complex.  Lives with her husband and 5 children.  Her oldest daughter, age 51, is also pregnant.  Is taking her LT4 as directed.. Fetal movement: normal. 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  Began taking the acyclovir 400mg  three times daily earlier this week.  No burning, no dysuria, no vaginal discharge.  Visit conducted in Spanish.  Objective:    BP 130/76  Wt 182 lb 9 oz (82.81 kg)  LMP 11/29/2009  Physical Exam  Exam  FHT:  140 BPM  Uterine Size: 34 cm  Presentation: unsure   Vaginal inspection: irritation along vaginal mucosa, without vesicular lesions or discharge noted.   Assessment:    Pregnancy:  [id number]    Plan:    Patient Active Problem List  Diagnoses  . UNSPECIFIED GENITAL HERPES  . UNSPECIFIED HYPOTHYROIDISM  . Rh negative status during pregnancy  . Uterine fibroids affecting pregnancy  . Pregnancy, supervision of normal    kick counts, preterm labor precautions.   Noted that she received RHoGam on Jun 06, 2010.   She is instructed to have repeat TSH done to follow her hypothyroidism.  Is to complete 7 days of acyclovir treatment for outbreak (painless).  Will need to be on prophylaxis starting at 36 weeks, discussed with her.  Follow up in 2 Weeks.

## 2010-07-11 NOTE — Patient Instructions (Addendum)
Fue un Research officer, trade union.  Tiene 32 semanas de embarazo.  Siga tomando la acyclovir 400mg , tres veces por dia para el brote de herpes.   MAKE FOLLOWUP APPT IN 2 WEEKS WITH DR CHAMBERLAIN.  Mtodo para contar los movimientos fetales (Kick Count) (Fetal Movement Counts)   En los embarazos de alto riesgo se recomienda contar las pataditas, pero tambin es una buena idea que lo hagan todas las [place]. Comience a contarlas a las 28 semanas de embarazo. Los movimientos fetales aumentan luego de una comida Immunologist o de comer o beber algo dulce (el nivel de azcar en la sangre est ms alto). Tambin es importante beber gran cantidad de lquidos (hidratarse bien) antes de contar. Si se recuesta sobre el lado izquierdo mejorar la Designer, industrial/product, o puede sentarse en una silla cmoda con los brazos sobre el abdomen y sin distracciones que la rodeen.   CONTANDO:  Trate de contar a la [employer] lo haga.  Anote el da y la hora y luego cuente durante 2 horas. Debe sentir al menos 10 movimientos en 2 horas. Si no los siente, espere una hora y cuente nuevamente. Luego de [employer] tendr un patrn.   Debemos observar si hay cambios en el patrn o no hay suficientes pataditas en 2 horas. Le lleva ms tiempo contar los 10 movimientos?   SOLICITE ATENCIN MDICA SI:  Siente menos de 10 pataditas en 2 horas. Intntelo dos veces.  No siente movimientos durante 1 hora.  El patrn se modifica o le lleva ms tiempo Art gallery manager las 10 pataditas.  Siente que el beb no se mueve como lo hace habitualmente.     [facility]   Movimientos Comienzo hora Fin hora   [place]   Movimientos Comienzo hora Fin hora  S   Dom       Sylva        M   Mar       M   Mar        A   Mier       A   Mier        N   Jue       N   Jue        A   Vie       A   Vie            Sab           Sab        [place]        M   Mar        M   Mar        A   Mier       A   Mier        N   Jue       N   Jue        A   Vie       A   Vie            Sab  [address]   Dom       S   Dom        E   Lun       E   Lun        M   Mar       M   Mar        A   Mier       A   Mier        N   Jue       N   Jue        A   Vie       A   Vie            Sab           Sab        [place]        E   Lun       E   Lun        M   Mar       M   Mar        A   Mier       A   Mier        N   Jue       N   Jue        A   Vie       A   Vie            Sab           Sab         Trabajo de parto prematuro,  cuidados en Advice worker (Preterm Labor, Home Care)   Se denomina parto prematuro a las contracciones uterinas que causan la apertura (dilatacin), acortamiento y afinamiento  del cuello del tero, antes de las 37 semanas de [place]. Es la mayor causa de ingreso de mujeres embarazadas al hospital.   [place] causas del [place] prematuro son:  Katha Hamming de los casos se desencadena por motivos desconocidos.  Pequeas zonas de separacin de la placenta (abrupcin).  Polihidramnios (lquido en exceso en el saco amnitico).  Embarazo de gemelos o ms bebs.  Cerviz incompetente (no puede contener al beb debido a que el tejido es demasiado dbil).  Cambios hormonales.  Hemorragia vaginal en ms de uno de los trimestres.  Infeccin en el crvix, la vagin o la vejiga  El consumo de cigarrillos.  Sindrome antifosfolipdico Se produce cuando los anticuerpos afectan las protenas del organismo.   DIAGNSTICO Los factores que ayudan a predecir el trabajo de parto prematuro son:  Historias de Proofreader con trabajo de parto prematuro.  Vaginosis bacteriana en las mujeres que tuvieron trabajo de [place].  Monitoreo de la actividad uterina que demuestre contracciones uterinas.  La protena fibronectina fetal est elevada en mujeres con historia previa del trastorno.  La medicin del  largo del crvix con tcnicas de ultrasonido muestra signos de acortamiento antes de la fecha [place].  Realizando una evaluacin conjunta con fibronectina y ecografa cervical, se puede predecir mejor un parto prematuro inminente.  Otros factores de riesgo son: l No pertenecer a Biomedical engineer. l Embarazo a los 17 aos o menos. l Embarazo a los 35 aos o ms. l Nivel socioeconmico bajo. l Aumento  de peso deficiente Academic librarian.   PREVENCIN No todos los partos prematuros pueden prevenirse. Algunas contracciones prematuras pueden prevenirse con medidas simples.  Mantngase bien hidratada. Beba 8 vasos de Warehouse manager. Esto disminuye la posibilidad del parto prematuro. El porcentaje de partos prematuros aumenta en los meses de verano. La deshidratacin hace que el volumen de sangre disminuya. Esto aumenta la concentracin de oxitocina (la hormona que produce las contracciones uterinas) en la [place]. La hidratacin ayuda a prevenir este incremento.  Observe si se presentan signos de infeccin. Estos signos incluyen la sensacin de ardor o el aumento de la necesidad de Geographical information systems officer, secrecin vaginal anormal o el aumento de temperatura sin causa aparente. Esto puedo Engineer, drilling.  Concurra puntualmente a las citas con el profesional que la asiste. Comunquese inmediatamente con el mdico si siente contracciones uterinas.  Busque asesoramiento mdico si tiene preguntas o surge algn problema. Es mejor hacerle las preguntas al profesional que tener un trabajo de parto prematuro sin TEFL teacher.   MANEJO DEL PARTO [employer], DENTRO Y [facility] Hay varios cosas para controlar durante el parto prematuro. Entre ellas se incluyen tanto las medidas mdicas como el cuidado personal para usted y su beb. Generalmente este problema se trata en el hospital. Algunas medidas pueden ser de gran ayuda en el parto prematuro:  Hidratacin (oral o intravenosa) Beba 8 vasos de Musician.  Haga reposo en cama (en su casa o en el hospital) Puede resultarle de gran ayuda recostarse sobre el lado izquierdo.  Evite las [employer] y los orgasmos.  Algunos medicamentos (antibiticos) la ayudarn en la prevencin de infecciones. Corre ms riesgos si ha roto la bolsa o si las contracciones estn causadas por una infeccin [employer] medicamentos como se le indic.  Evaluacin del beb. Estas pruebas ayudarn al profesional a saber si el beb se encuentra en buen estado y lo que Media planner en caso que se produzca el nacimiento de [place] anticipada. Entre ella se incluyen: l El perfil biofsico l Pruebas de estrs y no estrs. l Amniocentesis, para evaluar la madurez pulmonar del feto. l ndice del volumen del lquido amnitico. l Prueba de ultrasonido.  Hay medicamentos que ayudan a que los pulmones del beb se desarrollen ms rpidamente. Esto puede suceder si el parto prematuro no puede detenerse.  El profesional podr darle otros consejos acerca de la preparacin en caso de un nacimiento prematuro. Puede ser de ayuda si es necesario administrar corticoides para ayudar a la maduracin de los pulmones del beb.  Los tocolticos (frmacos que ayudan a TEFL teacher las contracciones uterinas) pueden ayudar a Veterinary surgeon [address]  [address].  En algunos casos es beneficiosa la administracin de progesterona.   TRATAMIENTO El mejor tratamiento es la prevencin, Solicitor los factores de riesgo y la deteccin temprana. Asegrese de Science writer con el profesional cules son los signos y los sntomas (qu puede ocurrir) del Sport and exercise psychologist, especialmente si ha sufrido un trabajo de parto prematuro en un embarazo previo.   INSTRUCCIONES PARA EL CUIDADO DOMICILIARIO:  Consuma una dieta balanceada y nutritiva.  Tome las vitaminas como se le indic.  Beba entre 6 y 8 vasos de lquidos por Futures trader.  Descanse y duerma lo suficiente.  No tenga relaciones sexuales si tiene un trabajo  de parto prematuro o tiene alto riesgo de tenerlo.  Siga las recomendaciones de su mdico con respecto a las actividades, [address], anlisis de [place] y otros exmenes (ecografas, amniocentesis).  Evite el estrs.  Evite los trabajos extenuantes o la actividad fsica prolongada si tiene riesgo elevado de parto prematuro.  No fume.   SOLICITE ATENCIN MDICA DE INMEDIATO SI:  Tiene contracciones.  Siente dolor abdominal.  Presenta una hemorragia vaginal abundante.  Siente dolor al [employer].  Observa una hemorragia vaginal anormal.  La temperatura se eleva por encima de 102 F (38,9 C).   Document Released: 02/19/2005  Document Re-Released: 03/09/2009 [facility] Patient Information 2011 [place], [place]. Document Released: 08/19/2007  Document Re-Released: 04/30/2009 [facility] Patient Information 2011 [place], [place].

## 2010-07-11 NOTE — Progress Notes (Signed)
Summary: med change  Phone Note Other Incoming   Caller: kathy (gchd) Summary of Call: wanted to know if Acyclovir 400mg  can be substituted for valtrex due to pharmacy is not supplied with this med.  spoke with dr. Mauricio Po and he okayed the change and pt will take Acyclovir 400mg  three times a day due to the change. Initial call taken by: Loralee Pacas CMA,  July 01, 2010 3:40 PM  Follow-up for Phone Call        Will change medication in EMR, will transfer it to epic if that is not done automatically.  Follow-up by: Ardyth Gal MD,  July 02, 2010 9:41 PM    New/Updated Medications: ACYCLOVIR 400 MG TABS (ACYCLOVIR) one tab by mouth three times a day for outbreaks for 5 days Prescriptions: ACYCLOVIR 400 MG TABS (ACYCLOVIR) one tab by mouth three times a day for outbreaks for 5 days  #60 x 3   Entered by:   Ardyth Gal MD   Authorized by:   Loralee Pacas CMA   Signed by:   Ardyth Gal MD on 07/02/2010   Method used:   Print then Give to Patient   RxID:   [id number]

## 2010-07-12 ENCOUNTER — Encounter: Payer: Self-pay | Admitting: Family Medicine

## 2010-07-19 ENCOUNTER — Ambulatory Visit: Payer: Self-pay | Admitting: Family Medicine

## 2010-07-30 ENCOUNTER — Ambulatory Visit (INDEPENDENT_AMBULATORY_CARE_PROVIDER_SITE_OTHER): Payer: Self-pay | Admitting: Family Medicine

## 2010-07-30 ENCOUNTER — Encounter: Payer: Self-pay | Admitting: Family Medicine

## 2010-07-30 ENCOUNTER — Ambulatory Visit: Payer: Self-pay | Admitting: Family Medicine

## 2010-07-30 VITALS — BP 111/71 | Wt 185.0 lb

## 2010-07-30 DIAGNOSIS — O09529 Supervision of elderly multigravida, unspecified trimester: Secondary | ICD-10-CM

## 2010-07-30 DIAGNOSIS — A6000 Herpesviral infection of urogenital system, unspecified: Secondary | ICD-10-CM

## 2010-07-30 DIAGNOSIS — E039 Hypothyroidism, unspecified: Secondary | ICD-10-CM

## 2010-07-30 NOTE — Patient Instructions (Signed)
Remember to take your Thyroid medication every day.  I want you to take the Acyclovir, the medication for the herpes for the rest of your pregnancy.  Keep taking your prenatal vitamin every day too.    Please make a follow up appointment in two weeks.

## 2010-08-01 LAB — STREP B DNA PROBE: GBSP: POSITIVE

## 2010-08-03 ENCOUNTER — Encounter: Payer: Self-pay | Admitting: Family Medicine

## 2010-08-03 NOTE — Assessment & Plan Note (Signed)
Continue Acyclovir until delivery.  Pt understands she may need c-section if lesions do not resolve by delivery.

## 2010-08-03 NOTE — Progress Notes (Signed)
Subjective:    Jamie [name] is a 45 y.o. female being seen today for her obstetrical visit. She is at [redacted]w[redacted]d gestation. Patient reports feeling infrequent contractions.  She says she feels them at night and they are spaced far apart.  Fetal movement: normal.  She is concerned because she still has active HSV2 lesions.  She says she has been taking the acyclovir three times a day for about three weeks.  She says the lesions are not painful, and she thinks they are getting better, but they are still not gone. She has been taking her thyroid medication, but says she ran out of it two days ago and has not had a chance to get it refilled. She denies any other complaints.  Visit conducted with spanish interpreter.    Menstrual History: OB History    Grav Para Term Preterm Abortions TAB SAB Ect Mult Living   6 5 5  0 0 0 0 0 0 5         Review of Systems Pertinent items are noted in HPI.   Objective:    BP 111/71  Wt 185 lb (83.915 kg)  LMP 11/29/2009 FHT:  150 BPM  Uterine Size: size equals dates  Presentation: unsure   CV: RRR Pulm: CTAB ABD: Gravid, non-tender Extremities: No edema  Assessment:    Pregnancy 34 and 5/7 weeks   Plan:    28-week labs reviewed, normal 1) HSV- pt on appropriate dose of acyclovir, 400 mg PO TID.  At this point, we will plan for her to continue treatment until delivery.  She understands that if she has active lesions at the time of delivery, she may be advised to have a c-section. 2) Thyroid- most recent TSH 2.9, indicating good control.  I re-emphasised the importance of taking her thyroid medication.  Plan to re-check TSH in two weeks.  Follow up in 2 Weeks.

## 2010-08-03 NOTE — Assessment & Plan Note (Signed)
Continue levothyroxine at current dose, recheck TSH in two weeks.

## 2010-08-05 LAB — POCT URINALYSIS DIPSTICK
Bilirubin Urine: NEGATIVE
Glucose, UA: NEGATIVE mg/dL
Ketones, ur: NEGATIVE mg/dL
Nitrite: NEGATIVE
Protein, ur: >=300 mg/dL — AB
Specific Gravity, Urine: 1.025 (ref 1.005–1.030)
Urobilinogen, UA: 0.2 mg/dL (ref 0.0–1.0)
pH: 6.5 (ref 5.0–8.0)

## 2010-08-16 ENCOUNTER — Ambulatory Visit: Payer: Self-pay | Admitting: Family Medicine

## 2010-08-16 VITALS — BP 128/73 | Wt 188.2 lb

## 2010-08-16 DIAGNOSIS — Z6791 Unspecified blood type, Rh negative: Secondary | ICD-10-CM

## 2010-08-16 DIAGNOSIS — A6000 Herpesviral infection of urogenital system, unspecified: Secondary | ICD-10-CM

## 2010-08-16 DIAGNOSIS — Z3490 Encounter for supervision of normal pregnancy, unspecified, unspecified trimester: Secondary | ICD-10-CM

## 2010-08-16 DIAGNOSIS — E039 Hypothyroidism, unspecified: Secondary | ICD-10-CM

## 2010-08-16 NOTE — Patient Instructions (Signed)
Hypothyroidism and Pregnancy Hypothyroidism is a common condition seen in women more than men. It means you have an under-active thyroid gland. The thyroid gland is a hormone gland. It is located in your neck in front of your windpipe. This gland is controlled by the pituitary gland in your brain. The pituitary gland produces thyroid stimulating hormone (TSH). TSH controls the amount of thyroid hormone (TH) produced by the thyroid gland. With hypothyroidism, the gland does not produce enough TH. The body needs this hormone for metabolism. Metabolism is how your body works and Higher education careers adviser.  A baby (fetus) needs to get thyroid hormone from the mother. It is needed for normal growth and brain development. Babies who are born to mothers with hypothyroidism during pregnancy may have lowered IQ scores. They may also have low birth weight or be born prematurely. Their body movement may develop poorly, too. Common problems during pregnancy are fatigue and weight gain. These symptoms may be hidden by the pregnancy. Hypothyroidism can develop before or during pregnancy.  CAUSES  Thyroid gland abnormality.   The pituitary gland in the brain produces too much TSH.   The most common cause before, during and after pregnancy is chronic (lasting) thyroiditis. It is an autoimmune condition that affects the thyroid cells. This is called Hashimoto's disease.   Surgical removal of the thyroid (thyroidectomy).   Radioactive iodine treatment of the thyroid gland that can destroy the gland. This is not used during pregnancy since it can affect the baby.   Lack of iodine in your diet. Most salt products contains iodine.   Women with Type 1 diabetes have a 5 to 8% chance of developing hypothyroid disease while pregnant. Type 1 diabetic women have a 25% chance of developing the disease after they have their baby.  SYMPTOMS Symptoms of hypothyroidism can develop slowly. They can go undetected if the symptoms are mild. This  can be prevented with early detection in the pregnant mother. When you are pregnant, a mild form of hypothyroid disease can develop into a full blown disease because of an increase in the metabolism (destruction) of thyroid hormone in your body during pregnancy. If you are considering pregnancy, and you or an immediate family member have had problems thyroid problems, tell your caregiver. Make sure that you are watched closely as your caregiver suggests. Some problems you may have before or during pregnancy are:  Constipation.  Fatigue.   Intolerance to cold.  Mental weariness.   More advanced problems with hypothyroidism may include:   Hoarseness.  Swelling of the lower legs.   Dry and thickened skin.   Slowness of thinking.   Decreased sex drive (libido).   Slowed speech.   Weight gain.  Muscle cramps.   Insomnia.   Slow reflexes.   Changes in your voice (deeper).   Puffy face and feet.   Thin, coarse hair.  Thinning of eyebrows.   Decreased appetite.   Increase incidence of carpal tunnel syndrome.   Coma.   Signs of Hypothyroidism include:  Enlarged thyroid gland (goiter).   Small round growths (nodules) in the thyroid gland.   Increase in thyroid stimulating hormone. (A blood test is needed.)   Decrease in thyroid hormone. (A blood test is needed.)  Common problems before pregnancy can include:  Inability to get pregnant.   Changes in menstrual periods.   No menstrual period (amenorrhea).   Miscarriage.  Common thyroid problems during or after pregnancy can include:  The development of high blood pressure and  the chance of a premature delivery are more common.   Babies born to women with untreated hypothyroidism may not have good development of the brain.   Preterm delivery.   Low birth weight babies.   Preeclampsia.   Placental abruption.   Cretinism in baby (mental retardation, failure to grow, nerve (neurologic) and psychological  problems).   Stillbirth.  DIAGNOSIS Diagnosis is based upon the signs and symptoms of the patient. It is confirmed by blood tests ( increased TSH and decreased TH), ultrasound, and radioactive iodine uptake tests. The radioactive iodine uptake test is not done when you are pregnant. When hypothyroidism is diagnosed early, it can be treated. There should be no problems for you or your baby. Goiter or thyroid nodules found in a pregnant woman should be tested to be sure there is no cancer present. Anyone with a history or family history of thyroid disease should be tested for thyroid disease. TREATMENT When hypothyroidism is diagnosed early, it can be treated. Treatment during pregnancy should not cause any harm to your baby.  Your caregiver will watch your thyroid hormones (TSH and TH) closely during the pregnancy.   Increase or decrease the dose of thyroid medication as your caregiver advises. This medicine is safe for you and your baby.   Avoid medications or supplements that can block the absorption of the hormone you are taking. For instance, calcium and iron are medications that may decrease the benefits of your thyroid medicine. Taking medications several hours apart will help this.   THS and TH should be checked each month of the pregnancy. At times, they should be checked more often in the third trimester.   All the states, including [place], [place], offer screening tests for hypothyroidism in newborn babies.   If this disease is treated in the first few weeks after the baby is born, it can prevent abnormal growth and mental retardation. The pediatrician should be informed if the mother had hypothyroidism.  HOME CARE INSTRUCTIONS  Avoid medications or supplements, such as calcium and iron, that can block the absorption of the hormone you are taking. If you must take these medications, take them several hours apart.   Pregnant women should take multivitamins that contain iodine. Your  caregiver may suggest that you take of iodine.   Women planning to get pregnant should take a multivitamin containing iodine. Your caregiver may suggest that you take of iodine.   Include foods in your diet that contain iodine, such as iodized salt, spinach and shell fish.   Follow the advice of your caregiver regarding your medication and getting the necessary blood tests.   Get tested for thyroid disease before getting pregnant if you or someone in your family has had thyroid problems.   Get tested if you think you have symptoms of thyroid disease.  SEEK IMMEDIATE MEDICAL CARE IF:  You have decreased or no movements of the baby.   You develop muscle cramps.   You have belly (abdominal) pain.   You gain too much weight in one week (5 pounds or more).   You develop a severe headache or vision problems.   You develop severe swelling of your legs and ankles.   You develop a lump in your neck.   You think you have symptoms of thyroid disease.  MAKE SURE YOU:   Understand these instructions.   Will watch your condition.   Will get help right away if you are not doing well or get worse.  Document Released: 03/09/2007 Document Re-Released: 03/09/2009 [facility] Patient Information 2011 [place], [place].Herpes y Psychiatrist (Herpes and Pregnancy) El herpes genital (VHS) es una enfermedad de trasmisin sexual por virus que puede ser muy grave durante el Psychiatrist. Una vez que se contagia el VHS el virus permanece en el organismo.. Puede volver luego de sufrir cualquier tipo de estrs fsico o mental. Hay dos tipos de HSV:  VHS 1 - La infeccin generalmente se localiza en los labios y en la boca.   VHS 2-La infeccin se localiza en la vagina, en los genitales externos y el ano.  Sin embargo ambos tipos pueden Physicist, medical parte del cuerpo. La primera vez que se produce la infeccin por VHS es Conservation officer, nature. Hay ms llagas y ampollas, lo que resulta doloroso y dura mucho  tiempo.  El mayor riesgo para el feto se produce durante la primera infeccin (infeccin primaria). Tambin hay un riesgo elevado cuando la infeccin reaparece (recurrencia). El parto vaginal en una mujer infectada con herpes y llagas genitales activas hace que el recin nacido resulte infectado en casi la mitad o ms de las veces.Marland Kitchen An practicando una operacin cesrea (nacimiento realizando [place]) alrededor del 5% de los bebs resultan infectados. En especial si la bolsa de aguas (saco amnitico) se ha roto dos o ms horas antes del [place]. CAUSA El VHS es causado y diseminado:  A travs de una herida en la piel durante el contacto sexual con una persona que tiene VHS.   Por tocar una llaga o ampolla de VHS y posteriormente tocar alguna parte de su cuerpo.   Aunque no tenga llagas o sntomas.  Estas son algunas cosas que pueden causar Neomia Dear recurrencia del VHS:  Estrs mental.  [place] resistencia fsica debido a un resfro o infeccin.   Durante el ciclo menstrual.   Ciertas enfermedades.  Infeccin en la vagina por hongos.   Sequedad y Engineer, mining en la vagina durante el acto sexual.   [place].  Escalofros   Flujo vaginal.   Dolor en la vagina y en los genitales, sensacin de picazn y ardor..   Dolor de cabeza.   Dolores musculares.  Dolor al Beatrix Shipper.   Ganglios hinchados en el rea de ingle.   En casos raros, la persona no tiene sntomas o no sabe que tiene la infeccin.   DIAGNSTICO  Se realiza un cultivo de las llagas y [place].   Se indican anlisis de sangre para verificar si existen anticuerpos para los virus.  TRATAMIENTO Una vez que se contrae el VHS, no hay tratamiento para curar la infeccin. Se implementa un tratamiento para los sntomas. Hay medicamentos que [employer] sntomas y disminuir la extensin de dicha infeccin. Si a menudo usted tiene brotes, hay un medicamento que puede ser tomado diariamente para Air traffic controller. Hay  medicamentos seguros para usted y su beb en caso de embarazo. Pueden aliviar los sntomas, la recurrencia o prevenir una infeccin por VHS. Las mujeres embarazadas con historia de VHS pueden tomar el medicamento en las ltimas cuatro a seis semanas de Psychiatrist. Esto puede evitar la infeccin durante el parto. Si la bolsa amnitica se ha roto [employer] horas o ms o existe una infeccin activa por VHS, el mtodo ms seguro de nacimiento para el beb es la cesrea. Las mujeres que se han infectado con VHS antes del embarazo desarrollan anticuerpos contra el virus que pueden proteger al beb. Las mujeres infectadas con VHS en el rea genital pueden amamantar a  su bebe (el virus no est presente en la [employer]). El bebe no debe ser amamantado si la infeccin esta en el pecho. EFECTOS EN EL FETO INFECTADO POR HERPES La mitad o ms de los recin nacidos infectados mueren. Las tres cuartas partes de los que sobreviven tienen serios problemas oculares, neurolgicos y Art gallery manager. Si la infeccin por VHS ocurre por primera vez en Financial risk analyst trimestre del Psychiatrist, puede ocurrir un aborto. PARTO El beb no debera nacer por el canal del parto infectado activamente debido a los graves problemas que pueden sucederle a un beb con herpes. Un canal de parto con llagas activas (lesiones) pueden observarse en el examen fsico. Un canal de parto aparentemente normal tambin puede contener el virus. Realizar un cultivo antes del parto no asegura que el canal de parto no pueda contagiar el virus durante el nacimiento. El parto vaginal es an recomendado si las lesiones activas no son evidentes. Deber practicarse una cesrea si hay dudas con respecto a una infeccin por VHS en la zona genital. INSTRUCCIONES PARA EL CUIDADO DOMICILIARIO  Tome todos los medicamentos indicados por su mdico.   No mantenga relaciones sexuales mientras tenga una infeccin por VHS.   Use preservativo entre las infecciones.   Use ropa  interior de algodn.   Mantenga secas y limpias las reas infectadas.   Lvese las manos con jabn y agua caliente despus de tocar las reas infectadas de VHS de su cuerpo.   Trate de evitar problemas fsicos y situaciones estresantes que puedan favorecer este tipo de infeccin.   No use polvos, aerosoles ni jabones desodorantes en las zonas afectadas.   Comunquele a su pareja sexual si sufre una infeccin por VHS.  SOLICITE ATENCIN MDICA DE INMEDIATO SI:  Tiene una infeccin de VHS y no puede orinar.   Tiene una infeccin de VHS durante cualquier momento de su embarazo, especialmente en los ltimos 3 meses.   Observa llagas y ampollas en la zona genital y piensa que es una infeccin por VHS.   Sospecha que sufre alguna reaccin alrgica o efectos secundarios debido a los medicamentos que toma.  Document Released: 02/19/2005 Document Re-Released: 02/19/2008 [facility] Patient Information 2011 [place], [place].Psychiatrist (Pregnancy) Si planea quedar embarazada, es una buena idea concertar una cita de preconcepcin con el mdico para poder lograr un estilo de vida saludable ante de quedar embarazada. Esto incluye dieta, peso, ejercicio, el tomar vitaminas prenatales en especial cido flico (ayuda a prevenir defectos en el cerebro y la mdula espinal), evitar el alcohol, fumar, las drogas ilegales, problemas mdicos (diabetes, convulsiones), historial familiar de problemas genticos, condiciones de trabajo e inmunizaciones. Es mejor tener conocimiento de estas cosas y Radio producer algo antes de quedar embarazada. Si est embarazada, es necesario que siga ciertas pautas para tener un beb sano. Es muy importante Education officer, environmental controles prenatales adecuados y seguir las indicaciones del profesional que la asiste. La atencin prenatal incluye toda la asistencia mdica que usted recibe antes del nacimiento del beb. Esto ayuda a Publishing copy y [place]. INSTRUCCIONES PARA EL CUIDADO  DOMICILIARIO  Comience las consultas prenatales alrededor de la 12 semana de embarazo o lo antes posible. Al principio generalmente se programan cada mes. Se hacen ms frecuentes en los 2 ltimos meses antes del parto. Es importante que concurra a todas las citas con el profesional y siga sus instrucciones con Camera operator a los medicamentos que deba Chemical engineer, a la actividad fsica y a Psychologist, forensic.   Durante el embarazo debe obtener  nutrientes para usted y para su beb. Consuma una dieta normal y bien balanceada. Elija alimentos como carne, pescado, [place] y otros productos lcteos, vegetales, frutas, panes integrales y Research officer, trade union cul es el aumento de [place] ideal, segn su peso y Risk analyst. Beba gran cantidad de lquidos. Trate de beber 8 vasos de lquidos por Futures trader.   El alcohol se asocia a cierto nmero de defectos del nacimiento, incluyendo el sndrome de alcoholismo fetal. Lo mejor es evitarlo completamente El cigarrillo causa nacimientos prematuros y bebs de bajo peso al Psychologist, clinical. El consumo de alcohol y nicotina durante el embarazo tambin aumentan marcadamente la probabilidad de que el nio sea qumicamente dependiente en etapas posteriores de su vida y puede contribuir al sndrome de muerte sbita infantil (SMSI)   No consuma drogas.   Solo tome medicamentos prescriptos o de venta libre que le haya recomendado el profesional. Algunos medicamentos pueden causar problemas genticos y fsicos al beb   Las nuseas matinales pueden aliviarse si come Chiropodist en la cama. Coma dos galletitas antes de levantarse por la maana.   Las relaciones sexuales pueden continuarse hasta casi el final del embarazo, si no se presentan otros problemas como prdida prematura (antes de tiempo) de lquido amnitico, Restaurant manager, fast food vaginal, dolor durante las relaciones sexuales o dolor abdominal (en el vientre).   Practique ejercicios con regularidad. Consulte con el profesional  que la asiste si no sabe con certeza si determinados ejercicios son seguros.   No utilice la baera con agua caliente, baos turcos y saunas. Estos aumentan el riesgo de sufrir un desmayo o de prdida del conocimiento, y as Runner, broadcasting/film/video usted o el beb. La natacin es un buen ejercicio. Descanse todo lo que pueda e incluya una siesta despus de almorzar siempre que le sea posible, especialmente durante el tercer trimestre.   Evite los olores y las sustancias qumicas txicas.   No use zapatos de tacones altos, podra perder el equilibrio y caer.   No levante objetos de ms de 2,5 kg. Si levanta un objeto, flexione las piernas y los muslos, y no la espalda.   Evite los viajes largos, Paramedic trimestre.   Si debe viajar fuera de la ciudad o de su estado, lleve una copia de la historia clnica.  SOLICITE ATENCIN MDICA DE INMEDIATO SI:  La temperatura oral se eleva sin motivo por encima de 100.4 o segn le indique el profesional que lo asiste.   Tiene una prdida de lquido por la vagina. Si sospecha una ruptura de las [place], tmese la temperatura y llame al profesional para informarlo sobre esto.   Observa unas pequeas manchas o una hemorragia vaginal Notifique al profesional acerca de la cantidad y de cuntos apsitos est utilizando.   Contina teniendo nuseas y no obtiene alivio de los [employer] han [place], o vomita sangre o una sustancia similar a la borra del caf.   Presenta un dolor en la zona superior del abdomen.   Siente molestias en el ligamento redondo en la parte abdominal baja. El profesional que la asiste Hydrologist.   Siente pequeas contracciones del tero (matriz)   No siente que el beb se mueve, o percibe menos movimientos que antes.   Tiene nuseas y vmitos de [place] persistente y no puede controlarlos.   Siente dolor al [employer].   Brett Fairy hemorragia vaginal anormal.   Tiene diarrea persistente.   Sufre una cefalea  grave.   Tiene problemas visuales.  Comienza a sentir debilidad muscular.   Se siente mareada o sufre un desmayo.   Comienza a sentir falta de aire.   Siente dolor en el pecho.   Sufre dolor en la espalda que se irradia hacia la pierna y el pie.   Siente latidos cardacos irregulares o la frecuencia cardaca es muy rpida.   Aumenta excesivamente de peso en un perodo breve (2,5 kg en 3 a 5 das)   Se ve envuelta en una situacin de violencia domstica.  Document Released: 02/19/2005 Document Re-Released: 03/09/2009 [facility] Patient Information 2011 [place], [place].

## 2010-08-17 NOTE — Assessment & Plan Note (Addendum)
Continue prophylaxis. Will forward notes to PCP regarding possible scheduling of c-section if continues to have active lesions.

## 2010-08-17 NOTE — Assessment & Plan Note (Signed)
GBS positive - will need prophylaxis during labor.  [id number] at 37.1 weeks today. Rhogam given at 27 weeks 5 days  HSV on prophylaxis however with active lesions - to PCP to determine possible scheduling of c-section

## 2010-08-17 NOTE — Assessment & Plan Note (Signed)
Rhogam given at 27 weeks 5 days.

## 2010-08-17 NOTE — Assessment & Plan Note (Signed)
Continue replacement. Follow with TSH in one week/

## 2010-08-17 NOTE — Progress Notes (Signed)
  Subjective:    Patient ID: Jamie [name], female    DOB: 1965/07/16, 45 y.o.   MRN: [id number]  HPI Jamie [name] is a 45 y.o. female being seen today for her obstetrical visit. She is at [redacted]w[redacted]d gestation. Patient reports feeling infrequent contractions. She says she feels them at night and they are spaced far apart. 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 She says she has been taking the acyclovir as directed and thinks the lesions are improving but have not yet resolved. Reports compliance with thyroid medication.   Review of Systems     Objective:   Physical Exam  Cardiovascular:       No pedal edema   Abdominal:       Gravid - appropriate size for dates.          Assessment & Plan:  [id number] @ 37 1/7 weeks; AMA, hypothyroid, active genital herpes   1) HSV - continue prophylaxis. Still with active lesions. To PCP to determine scheduling of c-section (attempted to call [facility] but no answer) 2) Thyroid- most recent TSH 2.9, indicating good control. Re-emphasised the importance of taking her thyroid medication. Plan to re-check TSH in one week Follow up in 2 Weeks.  3) Rh negative - received Rhogam at 27 weeks 5 days 4) GBS positive- prophylaxis necessary during labor.  5) Follow up one week.

## 2010-08-23 ENCOUNTER — Ambulatory Visit (INDEPENDENT_AMBULATORY_CARE_PROVIDER_SITE_OTHER): Payer: Self-pay | Admitting: Family Medicine

## 2010-08-23 VITALS — BP 119/72 | Wt 189.2 lb

## 2010-08-23 DIAGNOSIS — Z3480 Encounter for supervision of other normal pregnancy, unspecified trimester: Secondary | ICD-10-CM

## 2010-08-23 DIAGNOSIS — E039 Hypothyroidism, unspecified: Secondary | ICD-10-CM

## 2010-08-23 LAB — TSH: TSH: 4.018 u[IU]/mL (ref 0.350–4.500)

## 2010-08-23 NOTE — Progress Notes (Signed)
  Subjective:    Patient ID: Jamie [name], female    DOB: 01/01/1966, 45 y.o.   MRN: [id number]  HPI HPI  Jamie [name] is a 45 y.o. female being seen today for her obstetrical visit. She is at [redacted]w[redacted]d gestation. Patient reports feeling infrequent contractions. She says she feels them at night and they are spaced far apart. 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 She says she has been taking the acyclovir as directed and thinks the lesions are improving but have not yet resolved. Reports compliance with thyroid medication.     Review of Systems     Objective:   Physical Exam        Assessment & Plan:  [id number] @ 38 2/7 weeks; AMA, hypothyroid, active genital herpes  1) HSV - continue prophylaxis. Still with active lesions. PCP discussed with OB faculty at [facility] - plan to manage expectantly for now and if in labor and still with active lesions proceed with C-Section 2) Thyroid- most recent TSH 2.9, indicating good control. Re-emphasised the importance of taking her thyroid medication. TSH today.  3) Rh negative - received Rhogam at 27 weeks 5 days  4) GBS positive- prophylaxis necessary during labor.  5) Follow up one week.

## 2010-08-28 ENCOUNTER — Ambulatory Visit (INDEPENDENT_AMBULATORY_CARE_PROVIDER_SITE_OTHER): Payer: Self-pay | Admitting: Family Medicine

## 2010-08-28 DIAGNOSIS — Z3480 Encounter for supervision of other normal pregnancy, unspecified trimester: Secondary | ICD-10-CM

## 2010-08-29 NOTE — Progress Notes (Signed)
Patient ID: Jamie [name], female DOB: [birth year], 45 y.o. MRN: [id number]  HPI  HPI  Jamie [name] is a 45 y.o. female being seen today for her obstetrical visit. She is at [redacted]w[redacted]d gestation. Patient reports feeling infrequent contractions. She says she feels them at night and they are spaced far apart. 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 She says she has been taking the acyclovir as directed and thinks the lesions are improving but have not yet resolved. Reports compliance with thyroid medication.      Assessment & Plan:   [id number] @ 38 6/7 weeks; AMA, hypothyroid, active genital herpes  1) HSV - continue prophylaxis. Still with active lesions. PCP discussed with OB faculty at [facility] - plan to manage expectantly for now and if in labor and still with active lesions proceed with C-Section  2) Thyroid- most recent TSH 2.9, indicating good control. Re-emphasised the importance of taking her thyroid medication. TSH today.  3) Rh negative - received Rhogam at 27 weeks 5 days  4) GBS positive- prophylaxis necessary during labor.  5) Follow up one week.  6) Desires BTL - has not signed papers - will discuss with OB faculty regarding plan for this with C-Section

## 2010-09-03 LAB — GC/CHLAMYDIA PROBE AMP, GENITAL: GC Probe Amp, Genital: NEGATIVE

## 2010-09-03 LAB — POCT URINALYSIS DIP (DEVICE)
Bilirubin Urine: NEGATIVE
Glucose, UA: NEGATIVE mg/dL
Ketones, ur: NEGATIVE mg/dL
Nitrite: NEGATIVE
pH: 5 (ref 5.0–8.0)

## 2010-09-03 LAB — URINE CULTURE: Colony Count: >=100000

## 2010-09-03 LAB — POCT PREGNANCY, URINE: Preg Test, Ur: NEGATIVE

## 2010-09-03 LAB — WET PREP, GENITAL: Trich, Wet Prep: NONE SEEN

## 2010-09-04 ENCOUNTER — Ambulatory Visit (INDEPENDENT_AMBULATORY_CARE_PROVIDER_SITE_OTHER): Payer: Self-pay | Admitting: Family Medicine

## 2010-09-04 DIAGNOSIS — Z3480 Encounter for supervision of other normal pregnancy, unspecified trimester: Secondary | ICD-10-CM

## 2010-09-05 NOTE — Progress Notes (Signed)
Karman Biswell is a 45 y.o. female being seen today for her obstetrical visit. She is at [redacted]w[redacted]d gestation. Patient reports feeling infrequent contractions. She says she feels them at night and they are spaced far apart. 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 She says she has been taking the acyclovir as directed and thinks the lesions are improving but have not yet resolved. Reports compliance with thyroid medication.  Assessment & Plan:   [id number] @ 38 6/7 weeks; AMA, hypothyroid, active genital herpes  1) HSV - continue prophylaxis. Still with active lesions. PCP discussed with OB faculty at [facility] - plan to manage expectantly for now and if in labor and still with active lesions proceed with C-Section  2) Thyroid- most recent TSH 4.018.Marland Kitchen Re-emphasised the importance of taking her thyroid medication.   3) Rh negative - received Rhogam at 27 weeks 5 days  4) GBS positive- prophylaxis necessary during labor.  5) Follow up one week.  6) Desires BTL - has not signed papers - discussed with Mayra Neer - patient will sign BTL papers at time of presentation

## 2010-09-11 ENCOUNTER — Ambulatory Visit (INDEPENDENT_AMBULATORY_CARE_PROVIDER_SITE_OTHER): Payer: Self-pay | Admitting: Family Medicine

## 2010-09-11 ENCOUNTER — Encounter: Payer: Self-pay | Admitting: Family Medicine

## 2010-09-11 ENCOUNTER — Inpatient Hospital Stay (HOSPITAL_COMMUNITY): Payer: Self-pay

## 2010-09-11 ENCOUNTER — Inpatient Hospital Stay (HOSPITAL_COMMUNITY)
Admission: AD | Admit: 2010-09-11 | Discharge: 2010-09-11 | Disposition: A | Discharge status: Home / Self Care | Payer: Self-pay | Source: Ambulatory Visit | Attending: Obstetrics & Gynecology | Admitting: Obstetrics & Gynecology

## 2010-09-11 DIAGNOSIS — O480 Post-term pregnancy: Secondary | ICD-10-CM | POA: Insufficient documentation

## 2010-09-11 DIAGNOSIS — Z3480 Encounter for supervision of other normal pregnancy, unspecified trimester: Secondary | ICD-10-CM

## 2010-09-11 LAB — CBC
MCH: 32.6 pg (ref 26.0–34.0)
MCHC: 34.5 g/dL (ref 30.0–36.0)
Platelets: 201 10*3/uL (ref 150–400)
RBC: 3.37 MIL/uL — ABNORMAL LOW (ref 3.87–5.11)

## 2010-09-11 LAB — TYPE AND SCREEN
ABO/RH(D): A NEG
Antibody Screen: NEGATIVE

## 2010-09-11 NOTE — Progress Notes (Signed)
Scattered vesicular lesions labia, various stages of healing consistent with history of genital herpes infection   Jamie [name] is a 45 y.o. female being seen today for her obstetrical visit. She is at [redacted]w[redacted]d gestation. Patient reports feeling infrequent contractions. She says she feels them at night and they are spaced far apart. 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 She says she has been taking the acyclovir as directed and thinks the lesions are improving but have not yet resolved. Reports compliance with thyroid medication.  Assessment & Plan:   [id number] @ 38 6/7 weeks; AMA, hypothyroid, active genital herpes  1) HSV - continue prophylaxis. Still with active lesions. PCP discussed with OB faculty at [facility] - plan to proceed with C-Section on 4/19 at 9:30 AM  2) Thyroid- most recent TSH 4.018.Marland Kitchen Re-emphasised the importance of taking her thyroid medication. Check one week.  3) Rh negative - received Rhogam at 27 weeks 5 days  4) GBS positive- prophylaxis necessary during labor.  5) Desires BTL - has not signed papers - discussed with Mayra Neer - patient will sign BTL papers at time of presentation 6) Reviewed dates of last menstrual period in Epic (11/30/10) vs last menstrual period (11/25/10) - discussed with Dr. Macon Large - will use 11/25/10 given that this is date listed in original new OB visit, on ultrasounds etc. This would make patient 41 weeks 4 days today (as opposed to 40.6) Will obtain NST / BPP today. Will change LMP to reflect this in Epic.

## 2010-09-11 NOTE — Patient Instructions (Signed)
Your C-section is 09/12/10 at 9:30 AM Please arrive at 7:30 AM.  Do not eat anything after midnight

## 2010-09-12 ENCOUNTER — Inpatient Hospital Stay (HOSPITAL_COMMUNITY)
Admission: RE | Admit: 2010-09-12 | Discharge: 2010-09-15 | DRG: 766 | Disposition: A | Discharge status: Home / Self Care | Payer: Medicaid Other | Source: Ambulatory Visit | Attending: Obstetrics & Gynecology | Admitting: Obstetrics & Gynecology

## 2010-09-12 ENCOUNTER — Other Ambulatory Visit: Payer: Self-pay | Admitting: Obstetrics & Gynecology

## 2010-09-12 DIAGNOSIS — Z302 Encounter for sterilization: Secondary | ICD-10-CM

## 2010-09-12 DIAGNOSIS — O09529 Supervision of elderly multigravida, unspecified trimester: Secondary | ICD-10-CM

## 2010-09-12 DIAGNOSIS — D649 Anemia, unspecified: Secondary | ICD-10-CM | POA: Diagnosis not present

## 2010-09-12 DIAGNOSIS — O99892 Other specified diseases and conditions complicating childbirth: Secondary | ICD-10-CM | POA: Diagnosis present

## 2010-09-12 DIAGNOSIS — Z22330 Carrier of Group B streptococcus: Secondary | ICD-10-CM

## 2010-09-12 DIAGNOSIS — O9989 Other specified diseases and conditions complicating pregnancy, childbirth and the puerperium: Secondary | ICD-10-CM

## 2010-09-12 DIAGNOSIS — O98519 Other viral diseases complicating pregnancy, unspecified trimester: Principal | ICD-10-CM | POA: Diagnosis present

## 2010-09-12 DIAGNOSIS — A6000 Herpesviral infection of urogenital system, unspecified: Secondary | ICD-10-CM

## 2010-09-12 DIAGNOSIS — O9903 Anemia complicating the puerperium: Secondary | ICD-10-CM | POA: Diagnosis not present

## 2010-09-12 LAB — SURGICAL PCR SCREEN: Staphylococcus aureus: NEGATIVE

## 2010-09-12 LAB — CBC
MCHC: 35.5 g/dL (ref 30.0–36.0)
Platelets: 152 10*3/uL (ref 150–400)
RDW: 12.9 % (ref 11.5–15.5)

## 2010-09-12 LAB — HEMOGLOBIN AND HEMATOCRIT, BLOOD
HCT: 24.3 % — ABNORMAL LOW (ref 36.0–46.0)
Hemoglobin: 8.5 g/dL — ABNORMAL LOW (ref 12.0–15.0)

## 2010-09-12 LAB — TYPE AND SCREEN

## 2010-09-13 LAB — CBC
Platelets: 142 10*3/uL — ABNORMAL LOW (ref 150–400)
RBC: 1.94 MIL/uL — ABNORMAL LOW (ref 3.87–5.11)
WBC: 12.5 10*3/uL — ABNORMAL HIGH (ref 4.0–10.5)

## 2010-09-13 NOTE — Op Note (Addendum)
Jamie [name], Jamie [name]      ACCOUNT NO.:  [id number]  MEDICAL RECORD NO.:  [id number]           PATIENT TYPE:  I  LOCATION:  9104                          FACILITY:  [facility]  PHYSICIAN:  Horton Chin, MD  Patient's DATE OF BIRTH:  [birth year]  DATE OF PROCEDURE:  09/12/2010 DATE OF DISCHARGE:                              OPERATIVE REPORT   PREOPERATIVE DIAGNOSIS:  A 45 year old, gravida 8, para 5-0-2-5, at 21 and 5 weeks' gestation who has active herpes simplex virus genital lesions and undesired fertility.  POSTOPERATIVE DIAGNOSES:  A 45 year old, gravida 8, para 5-0-2-5, at 16 and 5 weeks' gestation who has active herpes simplex virus genital lesions and undesired fertility, and findings of adenomyosis.  PROCEDURES:  Primary low transverse cesarean section and bilateral tubal ligation with Filshie clips.  SURGEONS: 1. Horton Chin, MD 2. Lucina Mellow, DO  ASSISTANT:  Priscella Mann, MD  ANESTHESIA:  Spinal.  FINDINGS:  A female infant in transverse position, Apgars of 2 at 1 and 9 at 5 minutes.  Cord pH is 7.41.  Normal uterus noted to have also adenomyosis.  Normal tubes, placenta, and ovaries.  The infant's weight was 8 pounds and 6 ounces.  SPECIMEN:  Placenta to Pathology.  ESTIMATED BLOOD LOSS:  1200 mL.  FLUIDS:  3100 mL.  URINE OUTPUT:  325 mL and clear.  COMPLICATIONS:  The position of the baby which was rotated and delivered in breech position.  There was a nuchal cord and adenomyosis.  INDICATIONS:  This is a 45 year old, gravida 8, para 5-0-2-5, at 80 and 5 weeks who was noted to have active genital herpetic lesions and it was decided that she has a primary C section.  PROCEDURE:  The patient was taken to the operating room where spinal anesthesia was found to be adequate.  She was then prepared and draped in the normal sterile fashion in the dorsal supine position with a leftward tilt.  A Pfannenstiel skin incision was then made  with the scalpel and carried through to the underlying fascia with the knife. The fascia was incised in the midline and excision was extended laterally with Mayo scissors.  The superior aspect of the fascial incision was grasped with Kocher clamps, elevated and the underlying rectus muscles were dissected off bluntly.  Attention was then turned to the inferior aspect of this incision which in a similar fashion was grasped, tented up with Kocher clamps, and rectus muscles dissected off bluntly.  The rectus muscles were then separated in the midline.  The peritoneum was identified and entered with blunt dissection.  The peritoneal opening was then extended superiorly, inferiorly, and laterally with good visualization of the uterus and bladder.  The bladder blade was inserted and the vesicouterine peritoneum was identified, grasped with pickups, and entered sharply with the Metzenbaum scissors.  The incision was then extended laterally with the bladder flap created digitally.  The bladder blade was then reinserted and the lower uterine segment was incised in a transverse fashion with a scalpel.  At this time, it was noted that the uterus was very thick with probable adenomyosis and a significant amount of bleeding was noted.  The  uterine incision was then extended laterally and upwardly with the Mayo scissors.  The bladder blade was removed and the infant presentation with noted to be transverse and was attempted to be rotated to more vertex position and this was not accomplished.  The infant was then rotated to a breech position and was delivered in the breech fashion.  The nose and mouth were suctioned on the table with the bulb suction.  The cord was clamped and cut.  The infant was given immediately to the awaiting pediatricians.  Cord blood sample and cord gas sample was obtained.  The placenta was then removed manually and the uterus was cleared of all clots and debris.  The uterine  incision was repaired with Monocryl in a running locked fashion.  A second layer of the same suture was used in an imbricating fashion to obtain excellent hemostasis.  The bladder flap was repaired with 3-0 Vicryl in a running stitch and the uterus was evaluated for hemostasis.  At this time, attention was then turned to the patient's right fallopian tube and was followed out to the fimbria. Babcock clamps were used for that procedure.  A Filshie clip was placed approximately 3 cm from cornual region and was returned to the abdomen. Then, attention was turned to the patient's left fallopian tube, was identified and followed out to the fimbria and again a Filshie clip was placed approximately 3 cm from the cornual region.  The gutters were cleared of all clots.  Again, was to have any bleeding and the peritoneum was closed with 3-0 Vicryl in a running fashion.  The fascia was then reapproximated with PDS in a running fashion.  The skin was closed with subcuticular stitch.  The patient tolerated the procedure well.  Sponge, lap, and needle counts were correct x3.  Two grams of cefotetan were given prior to the procedure.  The patient was taken to the recovery room in stable condition.  A postoperative hemoglobin will be obtained and blood transfusion will be provided as necessary.  The infant was moving all extremities at the time of this dictation and was in the recovery room considered to be well.    ______________________________ Lucina Mellow, DO   ______________________________ Horton Chin, MD    SH/MEDQ  D:  09/12/2010  T:  09/13/2010  Job:  [id number]  Electronically Signed by Lucina Mellow MD on 09/13/2010 05:49:17 PM Electronically Signed by Jaynie Collins MD on 09/16/2010 12:29:05 PM

## 2010-09-14 LAB — RH IMMUNE GLOB WKUP(>/=20WKS)(NOT WOMEN'S HOSP): Fetal Screen: NEGATIVE

## 2010-09-14 LAB — CBC
HCT: 17.9 % — ABNORMAL LOW (ref 36.0–46.0)
MCH: 32.4 pg (ref 26.0–34.0)
MCHC: 33.5 g/dL (ref 30.0–36.0)
MCV: 96.8 fL (ref 78.0–100.0)
RDW: 13.5 % (ref 11.5–15.5)
WBC: 18.7 10*3/uL — ABNORMAL HIGH (ref 4.0–10.5)

## 2010-10-04 NOTE — Discharge Summary (Signed)
  Jamie [name], Jamie [name]      ACCOUNT NO.:  [id number]  MEDICAL RECORD NO.:  [id number]           PATIENT TYPE:  LOCATION:                                 FACILITY:  PHYSICIAN:  Horton Chin, MD DATE OF BIRTH:  05-05-1966  DATE OF ADMISSION:  09/12/2010 DATE OF DISCHARGE:  09/15/2010                              DISCHARGE SUMMARY   REASON FOR ADMISSION:  Pregnancy at 28 and 5 days and early labor with active HSV lesion.  PROCEDURES:  She had a first-degree low-transverse cesarean section and bilateral tubal ligation with Filshie clips by Dr. Macon Large and Dr. Natale Milch with assistance of Dr. Madolyn Frieze under spinal anesthesia which produced a female infant with Apgar's of 2 and 9.  Postop course has been uneventful.  The patient is up ambulating well, taking p.o. fluids and solids well, going to the bathroom, emptying her bladder without a bowel movement, and is ready for discharge today.  ACTIVITY LEVEL:  No heavy lifting or driving for at least 2-4 weeks postop.  MEDICATIONS: 1. Percocet 5/325, 1 p.o. q.4 h. p.r.n. pain. 2. Motrin 600 p.o. q.6 h. p.r.n. cramping.  DIET:  Diet as tolerated.  FOLLOWUP:  We are going to have the [facility] nurse go out on postop day 5 through 7 for an incision check and a BP check.  She is to follow up with [facility] Department in 6 weeks.  PHYSICAL EXAMINATION TODAY:  VITAL SIGNS:  Stable. HEART:  Regular rhythm and rate. LUNGS:  Clear to auscultation bilaterally. ABDOMEN:  Soft.  Bowel sounds present in all 4 quadrants.  Incision is intact.  There is no redness, swelling, or drainage.  Fundus firm. Lochia, small amount.  There is no edema in lower extremities.    [name], N.M.   ______________________________ Horton Chin, MD   DL/MEDQ  D:  04/54/0981  T:  09/15/2010  Job:  [id number]  cc:   [facility] Department  Electronically Signed by Wyvonnia Dusky N.M. on 09/29/2010 08:59:05  AM Electronically Signed by Jaynie Collins MD on 10/04/2010 10:54:31 AM

## 2010-10-28 ENCOUNTER — Ambulatory Visit (INDEPENDENT_AMBULATORY_CARE_PROVIDER_SITE_OTHER): Payer: Medicaid Other | Admitting: Family Medicine

## 2010-10-28 ENCOUNTER — Encounter: Payer: Self-pay | Admitting: Family Medicine

## 2010-10-28 DIAGNOSIS — E039 Hypothyroidism, unspecified: Secondary | ICD-10-CM

## 2010-10-28 DIAGNOSIS — Z3490 Encounter for supervision of normal pregnancy, unspecified, unspecified trimester: Secondary | ICD-10-CM

## 2010-10-28 DIAGNOSIS — A6000 Herpesviral infection of urogenital system, unspecified: Secondary | ICD-10-CM

## 2010-10-28 DIAGNOSIS — B354 Tinea corporis: Secondary | ICD-10-CM

## 2010-10-28 DIAGNOSIS — Z3480 Encounter for supervision of other normal pregnancy, unspecified trimester: Secondary | ICD-10-CM

## 2010-10-28 MED ORDER — CLOTRIMAZOLE 1 % EX CREA: TOPICAL_CREAM | Freq: Two times a day (BID) | CUTANEOUS | Status: AC

## 2010-10-28 NOTE — Patient Instructions (Signed)
It was good to see you, congratulations on your baby.  We need to check your Thyroid function today as your medicine dose may need to be adjusted again now that you are no longer pregnant.  Your Tubal ligation was done when they did the C-section.  I will contact you with your lab results.

## 2010-10-28 NOTE — Progress Notes (Signed)
Subjective:     Jamie [name] is a 45 y.o. female who presents for a postpartum visit. She is 6 weeks postpartum following a low cervical transverse Cesarean section, tubal ligation was also performed at time of c-section. I have fully reviewed the prenatal and intrapartum course. The delivery was at 41.5 gestational weeks. Outcome: primary cesarean section, low transverse incision and cesarean indication: herpes virus infection. Anesthesia: spinal. Postpartum course has been routine.  Patient is still having a small amount of bleeding. Baby's course has been normal. Baby is feeding by bottle - .. Bleeding thin lochia. Bowel function is normal. Bladder function is normal. Patient is not sexually active. Contraception method is tubal ligation. Postpartum depression screening: negative.  Patient denies any current HSV lesions and is not taking Acyclovir at this time.  However, she complains of some itching on her arm, she says it is fungus that she has had before.   Review of Systems Pertinent items are noted in HPI.   Objective:    BP 128/79  Pulse 73  Temp(Src) 98 F (36.7 C) (Oral)  Wt 166 lb (75.297 kg)  LMP 11/24/2009  General:  alert, cooperative and no distress   Breasts:  inspection negative, no nipple discharge or bleeding, no masses or nodularity palpable  Lungs: clear to auscultation bilaterally  Heart:  regular rate and rhythm, S1, S2 normal, no murmur, click, rub or gallop  Abdomen: soft, non-tender; bowel sounds normal; no masses,  no organomegaly and LTCS incision healing well and in tact, no open areas or drainage.     External Genitalia: No active herpes lesions Skin: Patient has several ring shaped lesions on her right inner arm that are erythematous rings with clearing in the center.  Appear to be Tinea Assessment:   Normal postpartum exam.  Plan:    1. Contraception: tubal ligation 2. Will recheck TSH and adjust Synthroid appropriately.  3. HSV non-active  at this time, continue Acyclovir PRN 4. Will Rx Lotrimin for Tinea.  3. Follow up in: 6 months or as needed.

## 2011-03-11 ENCOUNTER — Encounter (HOSPITAL_COMMUNITY): Payer: Self-pay | Admitting: *Deleted

## 2012-08-06 ENCOUNTER — Observation Stay (HOSPITAL_COMMUNITY)
Admission: EM | Admit: 2012-08-06 | Discharge: 2012-08-07 | Disposition: A | Discharge status: Home / Self Care | Payer: Self-pay | Attending: Internal Medicine | Admitting: Internal Medicine

## 2012-08-06 ENCOUNTER — Encounter (HOSPITAL_COMMUNITY): Payer: Self-pay | Admitting: Cardiology

## 2012-08-06 DIAGNOSIS — K219 Gastro-esophageal reflux disease without esophagitis: Secondary | ICD-10-CM | POA: Insufficient documentation

## 2012-08-06 DIAGNOSIS — D500 Iron deficiency anemia secondary to blood loss (chronic): Principal | ICD-10-CM | POA: Insufficient documentation

## 2012-08-06 DIAGNOSIS — Z79899 Other long term (current) drug therapy: Secondary | ICD-10-CM | POA: Insufficient documentation

## 2012-08-06 DIAGNOSIS — E7800 Pure hypercholesterolemia, unspecified: Secondary | ICD-10-CM | POA: Insufficient documentation

## 2012-08-06 DIAGNOSIS — I10 Essential (primary) hypertension: Secondary | ICD-10-CM | POA: Diagnosis present

## 2012-08-06 DIAGNOSIS — D649 Anemia, unspecified: Secondary | ICD-10-CM | POA: Diagnosis present

## 2012-08-06 DIAGNOSIS — E039 Hypothyroidism, unspecified: Secondary | ICD-10-CM | POA: Diagnosis present

## 2012-08-06 DIAGNOSIS — B009 Herpesviral infection, unspecified: Secondary | ICD-10-CM | POA: Insufficient documentation

## 2012-08-06 DIAGNOSIS — N920 Excessive and frequent menstruation with regular cycle: Secondary | ICD-10-CM | POA: Insufficient documentation

## 2012-08-06 DIAGNOSIS — R5381 Other malaise: Secondary | ICD-10-CM | POA: Insufficient documentation

## 2012-08-06 HISTORY — DX: Pure hypercholesterolemia, unspecified: E78.00

## 2012-08-06 HISTORY — DX: Gastro-esophageal reflux disease without esophagitis: K21.9

## 2012-08-06 HISTORY — DX: Essential (primary) hypertension: I10

## 2012-08-06 LAB — COMPREHENSIVE METABOLIC PANEL
Alkaline Phosphatase: 67 U/L (ref 39–117)
BUN: 13 mg/dL (ref 6–23)
Chloride: 103 mEq/L (ref 96–112)
GFR calc Af Amer: >90 mL/min (ref >90)
GFR calc non Af Amer: 80 mL/min — ABNORMAL LOW (ref >90)
Glucose, Bld: 100 mg/dL — ABNORMAL HIGH (ref 70–99)
Potassium: 3.8 mEq/L (ref 3.5–5.1)
Total Bilirubin: 0.8 mg/dL (ref 0.3–1.2)
Total Protein: 7.8 g/dL (ref 6.0–8.3)

## 2012-08-06 LAB — CBC WITH DIFFERENTIAL/PLATELET
Eosinophils Relative: 1 % (ref 0–5)
Lymphs Abs: 2 10*3/uL (ref 0.7–4.0)
MCH: 19.4 pg — ABNORMAL LOW (ref 26.0–34.0)
MCV: 66.4 fL — ABNORMAL LOW (ref 78.0–100.0)
Monocytes Absolute: 0.5 10*3/uL (ref 0.1–1.0)
Platelets: 280 10*3/uL (ref 150–400)
RBC: 3.6 MIL/uL — ABNORMAL LOW (ref 3.87–5.11)

## 2012-08-06 LAB — URINE MICROSCOPIC-ADD ON

## 2012-08-06 LAB — RETICULOCYTES: Retic Count, Absolute: 64.8 10*3/uL (ref 19.0–186.0)

## 2012-08-06 LAB — URINALYSIS, ROUTINE W REFLEX MICROSCOPIC
Bilirubin Urine: NEGATIVE
Glucose, UA: NEGATIVE mg/dL
Ketones, ur: NEGATIVE mg/dL
Leukocytes, UA: NEGATIVE
pH: 5.5 (ref 5.0–8.0)

## 2012-08-06 LAB — PREPARE RBC (CROSSMATCH)

## 2012-08-06 MED ORDER — ONDANSETRON HCL 4 MG/2ML IJ SOLN: 4.0000 mg | Freq: Four times a day (QID) | INTRAMUSCULAR | Status: DC | PRN

## 2012-08-06 MED ORDER — ACETAMINOPHEN 650 MG RE SUPP: 650.0000 mg | Freq: Four times a day (QID) | RECTAL | Status: DC | PRN

## 2012-08-06 MED ORDER — SODIUM CHLORIDE 0.9 % IJ SOLN: 3.0000 mL | INTRAMUSCULAR | Status: DC | PRN

## 2012-08-06 MED ORDER — DOCUSATE SODIUM 100 MG PO CAPS
100.0000 mg | ORAL_CAPSULE | Freq: Two times a day (BID) | ORAL | Status: DC
  Administered 2012-08-07: 100 mg via ORAL
  Filled 2012-08-06 (×3): qty 1

## 2012-08-06 MED ORDER — SODIUM CHLORIDE 0.9 % IJ SOLN: 3.0000 mL | Freq: Two times a day (BID) | INTRAMUSCULAR | Status: DC

## 2012-08-06 MED ORDER — DIPHENHYDRAMINE HCL 25 MG PO CAPS
25.0000 mg | ORAL_CAPSULE | Freq: Once | ORAL | Status: AC
  Administered 2012-08-06: 25 mg via ORAL
  Filled 2012-08-06: qty 1

## 2012-08-06 MED ORDER — LEVOTHYROXINE SODIUM 50 MCG PO TABS
50.0000 ug | ORAL_TABLET | Freq: Every day | ORAL | Status: DC
  Administered 2012-08-07: 50 ug via ORAL
  Filled 2012-08-06: qty 1

## 2012-08-06 MED ORDER — FAMOTIDINE 20 MG PO TABS
20.0000 mg | ORAL_TABLET | Freq: Every day | ORAL | Status: DC
  Administered 2012-08-07: 20 mg via ORAL
  Filled 2012-08-06: qty 1

## 2012-08-06 MED ORDER — INFLUENZA VIRUS VACC SPLIT PF IM SUSP: 0.5000 mL | INTRAMUSCULAR | Status: DC | PRN

## 2012-08-06 MED ORDER — HYDROCODONE-ACETAMINOPHEN 5-325 MG PO TABS: 1.0000 | ORAL_TABLET | ORAL | Status: DC | PRN

## 2012-08-06 MED ORDER — SODIUM CHLORIDE 0.9 % IV SOLN
500.0000 mL | Freq: Once | INTRAVENOUS | Status: AC
  Administered 2012-08-06: 500 mL via INTRAVENOUS

## 2012-08-06 MED ORDER — ACETAMINOPHEN 325 MG PO TABS
650.0000 mg | ORAL_TABLET | Freq: Once | ORAL | Status: AC
  Administered 2012-08-06: 650 mg via ORAL
  Filled 2012-08-06: qty 2

## 2012-08-06 MED ORDER — LISINOPRIL 10 MG PO TABS
10.0000 mg | ORAL_TABLET | Freq: Every day | ORAL | Status: DC
  Administered 2012-08-07: 10 mg via ORAL
  Filled 2012-08-06: qty 1

## 2012-08-06 MED ORDER — ONDANSETRON HCL 4 MG PO TABS: 4.0000 mg | ORAL_TABLET | Freq: Four times a day (QID) | ORAL | Status: DC | PRN

## 2012-08-06 MED ORDER — ACETAMINOPHEN 325 MG PO TABS
650.0000 mg | ORAL_TABLET | Freq: Four times a day (QID) | ORAL | Status: DC | PRN
  Filled 2012-08-06: qty 2

## 2012-08-06 MED ORDER — SODIUM CHLORIDE 0.9 % IJ SOLN
3.0000 mL | Freq: Two times a day (BID) | INTRAMUSCULAR | Status: DC
  Administered 2012-08-06: 3 mL via INTRAVENOUS

## 2012-08-06 MED ORDER — SODIUM CHLORIDE 0.9 % IV SOLN: 250.0000 mL | INTRAVENOUS | Status: DC | PRN

## 2012-08-06 NOTE — H&P (Signed)
PCP: Quintella Reichert   Chief Complaint:   fatigue  HPI: Jamie [name] is a 47 y.o. female   has a past medical history of Thyroid disease; Rh negative status during pregnancy (06/26/2010); Herpes simplex without mention of complication; Hypercholesteremia; HTN (hypertension); and GERD (gastroesophageal reflux disease).   Presented with  Patient reports feeling fatigued over the past 2 months. Denies any chest pain or shortness of breath. She presented to the PCP office today and was found to have hg of 7 and TSH of 19. She does have hx of heavy menses denies any blood in her stool. She have not seen OB/GYN for this problem. She is already on levothyroxine for hx of Hypothyroidism. Patient is being admitted for symptomatic anemia and will get transfusion. Hemoccult negative in ED.    Review of Systems:    Pertinent positives include: fatigue, heavy menses, she have had some MSK back pain complaints for the past 2 wks.  Constitutional:  No weight loss, night sweats, Fevers, chills, weight loss  HEENT:  No headaches, Difficulty swallowing,Tooth/dental problems,Sore throat,  No sneezing, itching, ear ache, nasal congestion, post nasal drip,  Cardio-vascular:  No chest pain, Orthopnea, PND, anasarca, dizziness, palpitations.no Bilateral lower extremity swelling  GI:  No heartburn, indigestion, abdominal pain, nausea, vomiting, diarrhea, change in bowel habits, loss of appetite, melena, blood in stool, hematemesis Resp:  no shortness of breath at rest. No dyspnea on exertion, No excess mucus, no productive cough, No non-productive cough, No coughing up of blood.No change in color of mucus.No wheezing. Skin:  no rash or lesions. No jaundice GU:  no dysuria, change in color of urine, no urgency or frequency. No straining to urinate.  No flank pain.  Musculoskeletal:  No joint pain or no joint swelling. No decreased range of motion. No back pain.  Psych:  No change in mood or affect. No  depression or anxiety. No memory loss.  Neuro: no localizing neurological complaints, no tingling, no weakness, no double vision, no gait abnormality, no slurred speech, no confusion  Otherwise ROS are negative except for above, 10 systems were reviewed  Past Medical History: Past Medical History  Diagnosis Date  . Thyroid disease     hypothyroid  . Rh negative status during pregnancy 06/26/2010  . Herpes simplex without mention of complication   . Hypercholesteremia   . HTN (hypertension)   . GERD (gastroesophageal reflux disease)    History reviewed. No pertinent past surgical history.   Medications: Prior to Admission medications   Medication Sig Start Date End Date Taking? Authorizing Palmer Shorey  levothyroxine (SYNTHROID, LEVOTHROID) 25 MCG tablet Take 25 mcg by mouth daily.   Yes Historical Lliam Hoh, MD  lisinopril (PRINIVIL,ZESTRIL) 10 MG tablet Take 10 mg by mouth daily.   Yes Historical Bravery Ketcham, MD  ranitidine (ZANTAC) 300 MG tablet Take 300 mg by mouth as needed for heartburn.   Yes Historical Koleton Duchemin, MD    Allergies:  No Known Allergies  Social History:  Ambulatory  independently  Lives at  home   reports that she has never smoked. She does not have any smokeless tobacco history on file. She reports that she does not drink alcohol or use illicit drugs.   Family History: family history includes Diabetes in her sisters and Heart disease in her father.    Physical Exam: Patient Vitals for the past 24 hrs:  BP Temp Temp src Pulse Resp SpO2  08/06/12 2100 138/73 mmHg - - 73 18 100 %  08/06/12 2045 131/68  mmHg - - 66 13 100 %  08/06/12 2030 128/68 mmHg - - 63 14 100 %  08/06/12 1751 134/75 mmHg 98.1 F (36.7 C) Oral 76 18 100 %    1. General:  in No Acute distress 2. Psychological: Alert and  Oriented 3. Head/ENT:   Moist  Mucous Membranes                          Head Non traumatic, neck supple                          Normal Dentition                           Pale eye conjuctiva 4. SKIN: normal  Skin turgor,  Skin clean Dry and intact no rash, pale 5. Heart: Regular rate and rhythm no Murmur, Rub or gallop 6. Lungs: Clear to auscultation bilaterally, no wheezes or crackles   7. Abdomen: Soft, non-tender, Non distended 8. Lower extremities: no clubbing, cyanosis, or edema 9. Neurologically Grossly intact, moving all 4 extremities equally 10. MSK: Normal range of motion  body mass index is unknown because there is no height or weight on file.   Labs on Admission:   Recent Labs  08/06/12 1859  NA 136  K 3.8  CL 103  CO2 22  GLUCOSE 100*  BUN 13  CREATININE 0.85  CALCIUM 9.6    Recent Labs  08/06/12 1859  AST 18  ALT 8  ALKPHOS 67  BILITOT 0.8  PROT 7.8  ALBUMIN 4.0   No results found for this basename: LIPASE, AMYLASE,  in the last 72 hours  Recent Labs  08/06/12 1859  WBC 7.6  NEUTROABS 4.9  HGB 7.0*  HCT 23.9*  MCV 66.4*  PLT 280   No results found for this basename: CKTOTAL, CKMB, CKMBINDEX, TROPONINI,  in the last 72 hours No results found for this basename: TSH, T4TOTAL, FREET3, T3FREE, THYROIDAB,  in the last 72 hours No results found for this basename: VITAMINB12, FOLATE, FERRITIN, TIBC, IRON, RETICCTPCT,  in the last 72 hours No results found for this basename: HGBA1C    CrCl is unknown because there is no height on file for the current visit. ABG No results found for this basename: phart, pco2, po2, hco3, tco2, acidbasedef, o2sat     No results found for this basename: DDIMER     UA no evidence of infection   Cultures:    Component Value Date/Time   SDES URINE, RANDOM 09/29/2008 1314   SPECREQUEST NONE 09/29/2008 1314   CULT  Value: GROUP B STREP(S.AGALACTIAE)ISOLATED Note: TESTING AGAINST S. AGALACTIAE NOT ROUTINELY PERFORMED DUE TO PREDICTABILITY OF AMP/PEN/VAN SUSCEPTIBILITY. 09/29/2008 1314   REPTSTATUS 09/30/2008 FINAL 09/29/2008 1314       Radiological Exams on Admission: No results  found.  Chart has been reviewed  Assessment/Plan  47 yo F with symptomatic anemia due to heavy menses and poorly controlled hypothyroidism.  Present on Admission:  . Unspecified hypothyroidism - increased her levothyroxine to 50 and recheck TSH in 1 year, check T4, T3 . Anemia - transfuse 1 unit of PRBC, anemia panel. Will need to have. referal to Gynacology after discharge.  Marland Kitchen HTN - continue home medication    Prophylaxis: SCD    CODE STATUS: FULL CODE  Other plan as per orders.  I have spent a total of  55 min on this admission  DOUTOVA,ANASTASSIA 08/06/2012, 9:25 PM

## 2012-08-06 NOTE — ED Notes (Signed)
Pt sent here by pcp for hgb of 7.0.  Pt denies any changes in bowel habits, denies blood in stool.  Pt denies pain on palpation of abd.  States hx of heavy menses and states it has not been heavier than normal.  C/o urinary urgency with difficulty urinating.

## 2012-08-06 NOTE — ED Provider Notes (Signed)
History     CSN: [id number]  Arrival date & time 08/06/12  1728   First MD Initiated Contact with Patient 08/06/12 1903      Chief Complaint  Patient presents with  . Abnormal Lab    (Consider location/radiation/quality/duration/timing/severity/associated sxs/prior treatment) HPI  Past Medical History  Diagnosis Date  . Thyroid disease     hypothyroid  . Rh negative status during pregnancy 06/26/2010  . Herpes simplex without mention of complication     History reviewed. No pertinent past surgical history.  History reviewed. No pertinent family history.  History  Substance Use Topics  . Smoking status: Never Smoker   . Smokeless tobacco: Not on file  . Alcohol Use: No    OB History   Grav Para Term Preterm Abortions TAB SAB Ect Mult Living   7 6 6  0 0 0 0 0 0 5      Review of Systems  Allergies  Review of patient's allergies indicates no known allergies.  Home Medications   Current Outpatient Rx  Name  Route  Sig  Dispense  Refill  . levothyroxine (SYNTHROID, LEVOTHROID) 25 MCG tablet   Oral   Take 25 mcg by mouth daily.         Marland Kitchen lisinopril (PRINIVIL,ZESTRIL) 10 MG tablet   Oral   Take 10 mg by mouth daily.         . ranitidine (ZANTAC) 300 MG tablet   Oral   Take 300 mg by mouth as needed for heartburn.           BP 134/75  Pulse 76  Temp(Src) 98.1 F (36.7 C) (Oral)  Resp 18  SpO2 100%  Physical Exam  ED Course  Procedures (including critical care time)  Labs Reviewed  CBC WITH DIFFERENTIAL - Abnormal; Notable for the following:    RBC 3.60 (*)    Hemoglobin 7.0 (*)    HCT 23.9 (*)    MCV 66.4 (*)    MCH 19.4 (*)    MCHC 29.3 (*)    RDW 18.1 (*)    All other components within normal limits  COMPREHENSIVE METABOLIC PANEL - Abnormal; Notable for the following:    Glucose, Bld 100 (*)    GFR calc non Af Amer 80 (*)    All other components within normal limits  URINALYSIS, ROUTINE W REFLEX MICROSCOPIC - Abnormal; Notable  for the following:    APPearance CLOUDY (*)    Hgb urine dipstick MODERATE (*)    Protein, ur 30 (*)    All other components within normal limits  URINE MICROSCOPIC-ADD ON - Abnormal; Notable for the following:    Squamous Epithelial / LPF MANY (*)    All other components within normal limits  PREGNANCY, URINE  TYPE AND SCREEN   No results found.   No diagnosis found.   Results for orders placed during the hospital encounter of 08/06/12  CBC WITH DIFFERENTIAL      Result Value Range   WBC 7.6  4.0 - 10.5 K/uL   RBC 3.60 (*) 3.87 - 5.11 MIL/uL   Hemoglobin 7.0 (*) 12.0 - 15.0 g/dL   HCT 86.5 (*) 78.4 - 69.6 %   MCV 66.4 (*) 78.0 - 100.0 fL   MCH 19.4 (*) 26.0 - 34.0 pg   MCHC 29.3 (*) 30.0 - 36.0 g/dL   RDW 29.5 (*) 28.4 - 13.2 %   Platelets 280  150 - 400 K/uL   Neutrophils Relative 66  43 - 77 %   Lymphocytes Relative 26  12 - 46 %   Monocytes Relative 6  3 - 12 %   Eosinophils Relative 1  0 - 5 %   Basophils Relative 1  0 - 1 %   Neutro Abs 4.9  1.7 - 7.7 K/uL   Lymphs Abs 2.0  0.7 - 4.0 K/uL   Monocytes Absolute 0.5  0.1 - 1.0 K/uL   Eosinophils Absolute 0.1  0.0 - 0.7 K/uL   Basophils Absolute 0.1  0.0 - 0.1 K/uL   RBC Morphology POLYCHROMASIA PRESENT     WBC Morphology ATYPICAL LYMPHOCYTES     Smear Review LARGE PLATELETS PRESENT    COMPREHENSIVE METABOLIC PANEL      Result Value Range   Sodium 136  135 - 145 mEq/L   Potassium 3.8  3.5 - 5.1 mEq/L   Chloride 103  96 - 112 mEq/L   CO2 22  19 - 32 mEq/L   Glucose, Bld 100 (*) 70 - 99 mg/dL   BUN 13  6 - 23 mg/dL   Creatinine, Ser 4.09  0.50 - 1.10 mg/dL   Calcium 9.6  8.4 - 81.1 mg/dL   Total Protein 7.8  6.0 - 8.3 g/dL   Albumin 4.0  3.5 - 5.2 g/dL   AST 18  0 - 37 U/L   ALT 8  0 - 35 U/L   Alkaline Phosphatase 67  39 - 117 U/L   Total Bilirubin 0.8  0.3 - 1.2 mg/dL   GFR calc non Af Amer 80 (*) >90 mL/min   GFR calc Af Amer >90  >90 mL/min  URINALYSIS, ROUTINE W REFLEX MICROSCOPIC      Result Value  Range   Color, Urine YELLOW  YELLOW   APPearance CLOUDY (*) CLEAR   Specific Gravity, Urine 1.008  1.005 - 1.030   pH 5.5  5.0 - 8.0   Glucose, UA NEGATIVE  NEGATIVE mg/dL   Hgb urine dipstick MODERATE (*) NEGATIVE   Bilirubin Urine NEGATIVE  NEGATIVE   Ketones, ur NEGATIVE  NEGATIVE mg/dL   Protein, ur 30 (*) NEGATIVE mg/dL   Urobilinogen, UA 0.2  0.0 - 1.0 mg/dL   Nitrite NEGATIVE  NEGATIVE   Leukocytes, UA NEGATIVE  NEGATIVE  PREGNANCY, URINE      Result Value Range   Preg Test, Ur NEGATIVE  NEGATIVE  URINE MICROSCOPIC-ADD ON      Result Value Range   Squamous Epithelial / LPF MANY (*) RARE   WBC, UA 0-2  <3 WBC/hpf   RBC / HPF 3-6  <3 RBC/hpf   Bacteria, UA RARE  RARE   Urine-Other FEW YEAST    TYPE AND SCREEN      Result Value Range   ABO/RH(D) A NEG     Antibody Screen PENDING     Sample Expiration 08/09/2012     No results found.  MDM  Spoke with Dr.Doutova will arrange for admission for 23 hour observation. Transfusion packed red cells ordered by me Diagnosis symptomatic anemia        Doug Sou, MD 08/06/12 2039

## 2012-08-06 NOTE — ED Notes (Signed)
Pt sent here by PCP for possible admission because Hgb 7.0. Pt reports she went there because she was feeling tried and her throat has been sore and she's been coughing a lot. Denies any chest pain, SOB but does have some weakness. Denies any hx of transfusions.

## 2012-08-06 NOTE — ED Notes (Signed)
Pt here with paper work that show lab values.

## 2012-08-06 NOTE — Progress Notes (Signed)
NURSING PROGRESS NOTE  Teairra Millar [id number] Admission Data: 08/06/2012 11:28 PM Attending Provider: Therisa Doyne, MD QIO:[id number], MD Code Status: full   Onia Shiflett is a 47 y.o. female patient admitted from ED:  -No acute distress noted.  -No complaints of shortness of breath.  -No complaints of chest pain.   Cardiac Monitoring: Box # [id number] in place. Cardiac monitor yields:normal sinus rhythm.  Blood pressure 143/80, pulse 65, temperature 98.2 F (36.8 C), temperature source Oral, resp. rate 20, SpO2 100.00%, unknown if currently breastfeeding.   IV Fluids:  IV in place, occlusive dsg intact without redness, IV cath hand left, condition patent and no redness none. Right, ac; NSL  Allergies:  Review of patient's allergies indicates no known allergies.  Past Medical History:   has a past medical history of Thyroid disease; Rh negative status during pregnancy (06/26/2010); Herpes simplex without mention of complication; Hypercholesteremia; HTN (hypertension); and GERD (gastroesophageal reflux disease).  Past Surgical History:   has no past surgical history on file.  Social History:   reports that she has never smoked. She does not have any smokeless tobacco history on file. She reports that she does not drink alcohol or use illicit drugs.  Skin: unremarkable  Patient/Family orientated to room. Information packet given to patient/family. Admission inpatient armband information verified with patient/family to include name and date of birth and placed on patient arm. Side rails up x 2, fall assessment and education completed with patient/family. Patient/family able to verbalize understanding of risk associated with falls and verbalized understanding to call for assistance before getting out of bed. Call light within reach. Patient/family able to voice and demonstrate understanding of unit orientation instructions.   Pt does not speak english. Son Lars Mage  is interpreting and at the bedside with the patient. Will continue to evaluate and treat per MD orders.

## 2012-08-06 NOTE — ED Provider Notes (Signed)
History     CSN: [id number]  Arrival date & time 08/06/12  1728   First MD Initiated Contact with Patient 08/06/12 1903    Chief complaint weakness  Chief Complaint  Patient presents with  . Abnormal Lab   History is obtained from medical interpreter using language line. Patient speaks no [place] (Consider location/radiation/quality/duration/timing/severity/associated sxs/prior treatment) HPI Patient went to her primary care doctor today for generalized weakness for several months. She was noted to have a hemoglobin of 7. She denies sore throat denies cough denies abdominal pain denies heavy periods. Last menstrual period was 07/26/2012. She admits to abdominal pain with bowel movements. No other complaint. No other associated symptoms. Lab work was done patient was sent here for further evaluation and admission for elevated TSH of 19.6 and hemoglobin of 7.0 noted to be microcytic. Past Medical History  Diagnosis Date  . Thyroid disease     hypothyroid  . Rh negative status during pregnancy 06/26/2010  . Herpes simplex without mention of complication     History reviewed. No pertinent past surgical history.  History reviewed. No pertinent family history.  History  Substance Use Topics  . Smoking status: Never Smoker   . Smokeless tobacco: Not on file  . Alcohol Use: No    OB History   Grav Para Term Preterm Abortions TAB SAB Ect Mult Living   7 6 6  0 0 0 0 0 0 5      Review of Systems  HENT: Negative.   Respiratory: Negative.   Cardiovascular: Negative.   Gastrointestinal: Positive for abdominal pain.  Endocrine: Negative.   Musculoskeletal: Negative.   Skin: Negative.   Neurological: Positive for weakness.  Psychiatric/Behavioral: Negative.   All other systems reviewed and are negative.    Allergies  Review of patient's allergies indicates no known allergies.  Home Medications   Current Outpatient Rx  Name  Route  Sig  Dispense  Refill  . levothyroxine  (SYNTHROID, LEVOTHROID) 25 MCG tablet   Oral   Take 25 mcg by mouth daily.         Marland Kitchen lisinopril (PRINIVIL,ZESTRIL) 10 MG tablet   Oral   Take 10 mg by mouth daily.         . ranitidine (ZANTAC) 300 MG tablet   Oral   Take 300 mg by mouth as needed for heartburn.           BP 134/75  Pulse 76  Temp(Src) 98.1 F (36.7 C) (Oral)  Resp 18  SpO2 100%  Physical Exam  Nursing note and vitals reviewed. Constitutional: She appears well-developed and well-nourished.  HENT:  Head: Normocephalic and atraumatic.  Eyes: Pupils are equal, round, and reactive to light.  Conjunctiva pale  Neck: Neck supple. No tracheal deviation present. No thyromegaly present.  Cardiovascular: Normal rate and regular rhythm.   No murmur heard. Pulmonary/Chest: Effort normal and breath sounds normal.  Abdominal: Soft. Bowel sounds are normal. She exhibits no distension. There is no tenderness.  Genitourinary: Guaiac negative stool.  Musculoskeletal: Normal range of motion. She exhibits no edema and no tenderness.  Neurological: She is alert. Coordination normal.  Skin: Skin is warm and dry. No rash noted.  Psychiatric: She has a normal mood and affect.    ED Course  Procedures (including critical care time)  Labs Reviewed  CBC WITH DIFFERENTIAL - Abnormal; Notable for the following:    RBC 3.60 (*)    Hemoglobin 7.0 (*)    HCT 23.9 (*)  MCV 66.4 (*)    MCH 19.4 (*)    MCHC 29.3 (*)    RDW 18.1 (*)    All other components within normal limits  COMPREHENSIVE METABOLIC PANEL - Abnormal; Notable for the following:    Glucose, Bld 100 (*)    GFR calc non Af Amer 80 (*)    All other components within normal limits  URINALYSIS, ROUTINE W REFLEX MICROSCOPIC  TYPE AND SCREEN   No results found.   No diagnosis found.  Results for orders placed during the hospital encounter of 08/06/12  CBC WITH DIFFERENTIAL      Result Value Range   WBC 7.6  4.0 - 10.5 K/uL   RBC 3.60 (*) 3.87 - 5.11  MIL/uL   Hemoglobin 7.0 (*) 12.0 - 15.0 g/dL   HCT 16.1 (*) 09.6 - 04.5 %   MCV 66.4 (*) 78.0 - 100.0 fL   MCH 19.4 (*) 26.0 - 34.0 pg   MCHC 29.3 (*) 30.0 - 36.0 g/dL   RDW 40.9 (*) 81.1 - 91.4 %   Platelets 280  150 - 400 K/uL   Neutrophils Relative 66  43 - 77 %   Lymphocytes Relative 26  12 - 46 %   Monocytes Relative 6  3 - 12 %   Eosinophils Relative 1  0 - 5 %   Basophils Relative 1  0 - 1 %   Neutro Abs 4.9  1.7 - 7.7 K/uL   Lymphs Abs 2.0  0.7 - 4.0 K/uL   Monocytes Absolute 0.5  0.1 - 1.0 K/uL   Eosinophils Absolute 0.1  0.0 - 0.7 K/uL   Basophils Absolute 0.1  0.0 - 0.1 K/uL   RBC Morphology POLYCHROMASIA PRESENT     WBC Morphology ATYPICAL LYMPHOCYTES     Smear Review LARGE PLATELETS PRESENT    COMPREHENSIVE METABOLIC PANEL      Result Value Range   Sodium 136  135 - 145 mEq/L   Potassium 3.8  3.5 - 5.1 mEq/L   Chloride 103  96 - 112 mEq/L   CO2 22  19 - 32 mEq/L   Glucose, Bld 100 (*) 70 - 99 mg/dL   BUN 13  6 - 23 mg/dL   Creatinine, Ser 7.82  0.50 - 1.10 mg/dL   Calcium 9.6  8.4 - 95.6 mg/dL   Total Protein 7.8  6.0 - 8.3 g/dL   Albumin 4.0  3.5 - 5.2 g/dL   AST 18  0 - 37 U/L   ALT 8  0 - 35 U/L   Alkaline Phosphatase 67  39 - 117 U/L   Total Bilirubin 0.8  0.3 - 1.2 mg/dL   GFR calc non Af Amer 80 (*) >90 mL/min   GFR calc Af Amer >90  >90 mL/min  URINALYSIS, ROUTINE W REFLEX MICROSCOPIC      Result Value Range   Color, Urine YELLOW  YELLOW   APPearance CLOUDY (*) CLEAR   Specific Gravity, Urine 1.008  1.005 - 1.030   pH 5.5  5.0 - 8.0   Glucose, UA NEGATIVE  NEGATIVE mg/dL   Hgb urine dipstick MODERATE (*) NEGATIVE   Bilirubin Urine NEGATIVE  NEGATIVE   Ketones, ur NEGATIVE  NEGATIVE mg/dL   Protein, ur 30 (*) NEGATIVE mg/dL   Urobilinogen, UA 0.2  0.0 - 1.0 mg/dL   Nitrite NEGATIVE  NEGATIVE   Leukocytes, UA NEGATIVE  NEGATIVE  PREGNANCY, URINE      Result Value Range  Preg Test, Ur NEGATIVE  NEGATIVE  URINE MICROSCOPIC-ADD ON      Result  Value Range   Squamous Epithelial / LPF MANY (*) RARE   WBC, UA 0-2  <3 WBC/hpf   RBC / HPF 3-6  <3 RBC/hpf   Bacteria, UA RARE  RARE   Urine-Other FEW YEAST    RETICULOCYTES      Result Value Range   Retic Ct Pct 1.9  0.4 - 3.1 %   RBC. 3.41 (*) 3.87 - 5.11 MIL/uL   Retic Count, Manual 64.8  19.0 - 186.0 K/uL  TYPE AND SCREEN      Result Value Range   ABO/RH(D) A NEG     Antibody Screen NEG     Sample Expiration 08/09/2012     Unit Number [id number]     Blood Component Type RED CELLS,LR     Unit division 00     Status of Unit ISSUED     Transfusion Status OK TO TRANSFUSE     Crossmatch Result Compatible    ABO/RH      Result Value Range   ABO/RH(D) A NEG    PREPARE RBC (CROSSMATCH)      Result Value Range   Order Confirmation ORDER PROCESSED BY BLOOD BANK     No results found.   MDM  Spoke with Dr.Doutova who will arrange for inpatient stay. I've ordered packed red cells to be transfused this patient is symptomatic from her anemia.        Doug Sou, MD 08/07/12 [phone]

## 2012-08-07 ENCOUNTER — Observation Stay (HOSPITAL_COMMUNITY): Payer: Self-pay

## 2012-08-07 LAB — COMPREHENSIVE METABOLIC PANEL
ALT: 8 U/L (ref 0–35)
AST: 16 U/L (ref 0–37)
Albumin: 3.6 g/dL (ref 3.5–5.2)
Alkaline Phosphatase: 63 U/L (ref 39–117)
CO2: 23 mEq/L (ref 19–32)
Chloride: 106 mEq/L (ref 96–112)
GFR calc non Af Amer: 79 mL/min — ABNORMAL LOW (ref >90)
Potassium: 3.6 mEq/L (ref 3.5–5.1)
Total Bilirubin: 2.5 mg/dL — ABNORMAL HIGH (ref 0.3–1.2)

## 2012-08-07 LAB — PREPARE RBC (CROSSMATCH)

## 2012-08-07 LAB — CBC
MCH: 20.7 pg — ABNORMAL LOW (ref 26.0–34.0)
MCHC: 31.1 g/dL (ref 30.0–36.0)
MCV: 66.8 fL — ABNORMAL LOW (ref 78.0–100.0)
Platelets: 248 10*3/uL (ref 150–400)
RDW: 19.3 % — ABNORMAL HIGH (ref 11.5–15.5)

## 2012-08-07 LAB — FOLATE: Folate: 19.6 ng/mL

## 2012-08-07 LAB — PHOSPHORUS: Phosphorus: 3.1 mg/dL (ref 2.3–4.6)

## 2012-08-07 LAB — IRON AND TIBC
Iron: 14 ug/dL — ABNORMAL LOW (ref 42–135)
UIBC: 517 ug/dL — ABNORMAL HIGH (ref 125–400)

## 2012-08-07 LAB — MAGNESIUM: Magnesium: 2 mg/dL (ref 1.5–2.5)

## 2012-08-07 MED ORDER — DIPHENHYDRAMINE HCL 50 MG/ML IJ SOLN
25.0000 mg | Freq: Once | INTRAMUSCULAR | Status: AC
  Administered 2012-08-07: 25 mg via INTRAVENOUS
  Filled 2012-08-07: qty 1

## 2012-08-07 MED ORDER — FUROSEMIDE 10 MG/ML IJ SOLN
INTRAMUSCULAR | Status: AC
  Filled 2012-08-07: qty 4

## 2012-08-07 MED ORDER — FERROUS SULFATE 325 (65 FE) MG PO TABS: 325.0000 mg | ORAL_TABLET | Freq: Two times a day (BID) | ORAL | Status: DC

## 2012-08-07 MED ORDER — LEVOTHYROXINE SODIUM 50 MCG PO TABS: 50.0000 ug | ORAL_TABLET | Freq: Every day | ORAL | Status: DC

## 2012-08-07 MED ORDER — FERROUS SULFATE 325 (65 FE) MG PO TABS
325.0000 mg | ORAL_TABLET | Freq: Two times a day (BID) | ORAL | Status: DC
  Administered 2012-08-07: 325 mg via ORAL
  Filled 2012-08-07 (×2): qty 1

## 2012-08-07 MED ORDER — FUROSEMIDE 10 MG/ML IJ SOLN
20.0000 mg | Freq: Once | INTRAMUSCULAR | Status: AC
  Administered 2012-08-07: 20 mg via INTRAVENOUS

## 2012-08-07 MED ORDER — ACETAMINOPHEN 325 MG PO TABS
650.0000 mg | ORAL_TABLET | Freq: Once | ORAL | Status: AC
  Administered 2012-08-07: 650 mg via ORAL

## 2012-08-07 NOTE — Progress Notes (Signed)
PATIENT DETAILS Name: Jamie [name] Age: 47 y.o. Sex: female Date of Birth: 1965-12-12 Admit Date: 08/06/2012 Admitting Physician Therisa Doyne, MD UYQ:[id number], MD  Subjective: Admitted with fatigue-h/o menorrhagia  Assessment/Plan: Active Problems:  Anemia -will receive a total of 2 units of PRBC this admission -Anemia panel still pending-not sure if this was done before or after patient received PRBC -Highly likely that this Fe def anemia from chronic menorrhgia -place on iron supplementation -check pelvic and Trans-vaginal ultrasound -if no significant abnormalities-she will be discharged today-she will be provided contact info for wom[facility] clinic for outpatient follow up -spoke with patient's son over the phone-and made family and patient aware of the plan -note-menstrual periods last 6 days-very heavy bleeding for the first 2 days-and claims that she uses "pampers" on top of her usual pads !! -no h/o melena or hematochezia.Guaic neg in the ED per H&P  Hypothyroidism  -increased her levothyroxine to 50 and recheck TSH in 3 months  Disposition: Remain inpatient  DVT Prophylaxis: SCD  Code Status: Full code   Procedures:  None  CONSULTS:  None  PHYSICAL EXAM: Vital signs in last 24 hours: Filed Vitals:   08/07/12 0420 08/07/12 1014 08/07/12 1037 08/07/12 1139  BP: 116/67 111/68 110/65 112/66  Pulse: 60 64 65 66  Temp: 97.6 F (36.4 C) 98.5 F (36.9 C) 98.2 F (36.8 C) 98.4 F (36.9 C)  TempSrc: Oral Oral Oral Oral  Resp: 18 18 18 18   Height:      Weight:      SpO2: 96%       Weight change:  Body mass index is 31.09 kg/(m^2).   Gen Exam: Awake and alert with clear speech.   Neck: Supple, No JVD.   Chest: B/L Clear.   CVS: S1 S2 Regular, no murmurs.  Abdomen: soft, BS +, non tender, non distended.  Extremities: no edema, lower extremities warm to touch. Neurologic: Non Focal.   Skin: No Rash.   Wounds: N/A.     Intake/Output from previous day:  Intake/Output Summary (Last 24 hours) at 08/07/12 1148 Last data filed at 08/07/12 1139  Gross per 24 hour  Intake 174.17 ml  Output      0 ml  Net 174.17 ml     LAB RESULTS: CBC  Recent Labs Lab 08/06/12 1859 08/07/12 0430  WBC 7.6 6.6  HGB 7.0* 7.8*  HCT 23.9* 25.1*  PLT 280 248  MCV 66.4* 66.8*  MCH 19.4* 20.7*  MCHC 29.3* 31.1  RDW 18.1* 19.3*  LYMPHSABS 2.0  --   MONOABS 0.5  --   EOSABS 0.1  --   BASOSABS 0.1  --     Chemistries   Recent Labs Lab 08/06/12 1859 08/07/12 0430  NA 136 139  K 3.8 3.6  CL 103 106  CO2 22 23  GLUCOSE 100* 94  BUN 13 11  CREATININE 0.85 0.86  CALCIUM 9.6 9.3  MG  --  2.0    CBG: No results found for this basename: GLUCAP,  in the last 168 hours  GFR Estimated Creatinine Clearance: 80.8 ml/min (by C-G formula based on Cr of 0.86).  Coagulation profile No results found for this basename: INR, PROTIME,  in the last 168 hours  Cardiac Enzymes No results found for this basename: CK, CKMB, TROPONINI, MYOGLOBIN,  in the last 168 hours  No components found with this basename: POCBNP,  No results found for this basename: DDIMER,  in the last 72 hours No results found for  this basename: HGBA1C,  in the last 72 hours No results found for this basename: CHOL, HDL, LDLCALC, TRIG, CHOLHDL, LDLDIRECT,  in the last 72 hours No results found for this basename: TSH, T4TOTAL, FREET3, T3FREE, THYROIDAB,  in the last 72 hours  Recent Labs  08/06/12 2149  RETICCTPCT 1.9   No results found for this basename: LIPASE, AMYLASE,  in the last 72 hours  Urine Studies No results found for this basename: UACOL, UAPR, USPG, UPH, UTP, UGL, UKET, UBIL, UHGB, UNIT, UROB, ULEU, UEPI, UWBC, URBC, UBAC, CAST, CRYS, UCOM, BILUA,  in the last 72 hours  MICROBIOLOGY: No results found for this or any previous visit (from the past 240 hour(s)).  RADIOLOGY STUDIES/RESULTS: No results  found.  MEDICATIONS: Scheduled Meds: . docusate sodium  100 mg Oral BID  . famotidine  20 mg Oral Daily  . ferrous sulfate  325 mg Oral BID WC  . furosemide  20 mg Intravenous Once  . levothyroxine  50 mcg Oral Daily  . lisinopril  10 mg Oral Daily  . sodium chloride  3 mL Intravenous Q12H  . sodium chloride  3 mL Intravenous Q12H   Continuous Infusions:  PRN Meds:.sodium chloride, acetaminophen, acetaminophen, HYDROcodone-acetaminophen, influenza  inactive virus vaccine, ondansetron (ZOFRAN) IV, ondansetron, sodium chloride  Antibiotics: Anti-infectives   None       Jeoffrey Massed, MD  [facility] Pager:[phone]  If 7PM-7AM, please contact night-coverage [email or web address] Password [email or web address] 08/07/2012, 11:48 AM   LOS: 1 day

## 2012-08-07 NOTE — Progress Notes (Signed)
Discussed discharge instructions with patient / daughter (as interpreter).  Appointments also reviewed with patient.  Instructions also included information (per patient request) on mennorhea.  Pt. ambulated to exit with daughter per her request to await for ride via private vehicle to her home.

## 2012-08-07 NOTE — Progress Notes (Signed)
UR completed 

## 2012-08-07 NOTE — Progress Notes (Signed)
Lars Mage (son) stated to call whenever we needed to. Contact info: [phone]

## 2012-08-07 NOTE — Discharge Summary (Signed)
PATIENT DETAILS Name: Jamie [name] Age: 47 y.o. Sex: female Date of Birth: 03/15/1966 MRN: [id number]. Admit Date: 08/06/2012 Admitting Physician: Therisa Doyne, MD WUJ:[id number],AOZHYQ, MD  Recommendations for Outpatient Follow-up:  Needs follow up with [facility] contact info provided to patient. Re-check TSH in 3 months as dose of levothyroxine increased Recheck H/H in 4 weeks  PRIMARY DISCHARGE DIAGNOSIS:  Active Problems:   Anemia-from Menorrhagia   Unspecified hypothyroidism   HTN (hypertension)      PAST MEDICAL HISTORY: Past Medical History  Diagnosis Date  . Thyroid disease     hypothyroid  . Rh negative status during pregnancy 06/26/2010  . Herpes simplex without mention of complication   . Hypercholesteremia   . HTN (hypertension)   . GERD (gastroesophageal reflux disease)     DISCHARGE MEDICATIONS:   Medication List    TAKE these medications       ferrous sulfate 325 (65 FE) MG tablet  Take 1 tablet (325 mg total) by mouth 2 (two) times daily with a meal.     levothyroxine 50 MCG tablet  Commonly known as:  SYNTHROID, LEVOTHROID  Take 1 tablet (50 mcg total) by mouth daily.     lisinopril 10 MG tablet  Commonly known as:  PRINIVIL,ZESTRIL  Take 10 mg by mouth daily.     ranitidine 300 MG tablet  Commonly known as:  ZANTAC  Take 300 mg by mouth as needed for heartburn.         BRIEF HPI:  See H&P, Labs, Consult and Test reports for all details in brief, patient is a 47 y.o. female -past medical history of Thyroid disease-Presented with feeling fatigued over the past 2 months,found to have hg of 7-admitted for further evaluation and treatment  CONSULTATIONS:   None  PERTINENT RADIOLOGIC STUDIES: No results found.   PERTINENT LAB RESULTS: CBC:  Recent Labs  08/06/12 1859 08/07/12 0430 08/07/12 1541  WBC 7.6 6.6  --   HGB 7.0* 7.8* 9.7*  HCT 23.9* 25.1* 29.7*  PLT 280 248  --    CMET CMP     Component  Value Date/Time   NA 139 08/07/2012 0430   K 3.6 08/07/2012 0430   CL 106 08/07/2012 0430   CO2 23 08/07/2012 0430   GLUCOSE 94 08/07/2012 0430   BUN 11 08/07/2012 0430   CREATININE 0.86 08/07/2012 0430   CALCIUM 9.3 08/07/2012 0430   PROT 6.9 08/07/2012 0430   ALBUMIN 3.6 08/07/2012 0430   AST 16 08/07/2012 0430   ALT 8 08/07/2012 0430   ALKPHOS 63 08/07/2012 0430   BILITOT 2.5* 08/07/2012 0430   GFRNONAA 79* 08/07/2012 0430   GFRAA >90 08/07/2012 0430    GFR Estimated Creatinine Clearance: 80.8 ml/min (by C-G formula based on Cr of 0.86). No results found for this basename: LIPASE, AMYLASE,  in the last 72 hours No results found for this basename: CKTOTAL, CKMB, CKMBINDEX, TROPONINI,  in the last 72 hours No components found with this basename: POCBNP,  No results found for this basename: DDIMER,  in the last 72 hours No results found for this basename: HGBA1C,  in the last 72 hours No results found for this basename: CHOL, HDL, LDLCALC, TRIG, CHOLHDL, LDLDIRECT,  in the last 72 hours No results found for this basename: TSH, T4TOTAL, FREET3, T3FREE, THYROIDAB,  in the last 72 hours  Recent Labs  08/06/12 2149  VITAMINB12 800  FOLATE 19.6  TIBC 531*  IRON 14*  RETICCTPCT 1.9   Coags:  No results found for this basename: PT, INR,  in the last 72 hours Microbiology: No results found for this or any previous visit (from the past 240 hour(s)).   BRIEF HOSPITAL COURSE:   Active Problems: Anemia -Highly likely that this Fe def anemia from chronic menorrhagia -Iron panel also consistent with Fe Def Anemia -note-menstrual periods last 6 days-very heavy bleeding for the first 2 days-and claims that she uses "pampers" on top of her usual pads !!  -no h/o melena or hematochezia.Guaic neg in the ED per H&P -received a total of 2 units of PRBC this admission-Hb at d/c is 9.7 -place on iron supplementation -pelvic and Trans-vaginal ultrasound-showed a enlarged uterus with heterogenous  myometrium. Endometrial thickness was 17mm.  -patient has been provided contact info for wom[facility] clinic for outpatient follow up-this was explained to the patient's son in detail by me over the phone-who then explained it to the patient and the patient's spouse-RN was present.Later during the afternoon, this was re-emphasized with the patient and her daughter who spoke english and was at the bedside.Unfortunately today is Saturday-follow up appt can not be scheduled as office is closed.  Hypothyroidism  -increased her levothyroxine to 50 and recheck TSH in 3 months  HTN -c/w Lisinopril  TODAY-DAY OF DISCHARGE:  Subjective:   Jamie [name] today has no headache,no chest abdominal pain,no new weakness tingling or numbness, feels much better wants to go home today.   Objective:   Blood pressure 109/61, pulse 72, temperature 98.2 F (36.8 C), temperature source Oral, resp. rate 20, height 5\' 3"  (1.6 m), weight 79.6 kg (175 lb 7.8 oz), SpO2 93.00%, unknown if currently breastfeeding.  Intake/Output Summary (Last 24 hours) at 08/07/12 1725 Last data filed at 08/07/12 1500  Gross per 24 hour  Intake 414.17 ml  Output      0 ml  Net 414.17 ml    Exam Awake Alert, Oriented *3, No new F.N deficits, Normal affect [place].AT,PERRAL Supple Neck,No JVD, No cervical lymphadenopathy appriciated.  Symmetrical Chest wall movement, Good air movement bilaterally, CTAB RRR,No Gallops,Rubs or new Murmurs, No Parasternal Heave +ve B.Sounds, Abd Soft, Non tender, No organomegaly appriciated, No rebound -guarding or rigidity. No Cyanosis, Clubbing or edema, No new Rash or bruise  DISCHARGE CONDITION: Stable  DISPOSITION:  HOME  DISCHARGE INSTRUCTIONS:    Activity:  As tolerated   Diet recommendation: Heart Healthy diet      Follow-up Information   Follow up with [facility]. (please call on 08/09/12 for appointment)    Contact information:   [address] [place] [address] [phone]      Follow up with Jearld Lesch, MD. Schedule an appointment as soon as possible for a visit in 1 week.   Contact information:   [address] [place] [address] [phone]       Total Time spent on discharge equals 45 minutes.  SignedJeoffrey Massed 08/07/2012 5:25 PM

## 2012-08-08 LAB — TYPE AND SCREEN
ABO/RH(D): A NEG
Unit division: 0

## 2012-08-09 ENCOUNTER — Encounter: Payer: Self-pay | Admitting: Obstetrics and Gynecology

## 2012-08-09 ENCOUNTER — Other Ambulatory Visit (HOSPITAL_COMMUNITY)
Admission: RE | Admit: 2012-08-09 | Discharge: 2012-08-09 | Disposition: A | Discharge status: Home / Self Care | Payer: Self-pay | Source: Ambulatory Visit | Attending: Obstetrics and Gynecology | Admitting: Obstetrics and Gynecology

## 2012-08-09 ENCOUNTER — Ambulatory Visit (INDEPENDENT_AMBULATORY_CARE_PROVIDER_SITE_OTHER): Payer: Self-pay | Admitting: Obstetrics and Gynecology

## 2012-08-09 VITALS — BP 116/75 | HR 93 | Temp 97.2°F | Wt 174.0 lb

## 2012-08-09 DIAGNOSIS — Z01812 Encounter for preprocedural laboratory examination: Secondary | ICD-10-CM

## 2012-08-09 DIAGNOSIS — N926 Irregular menstruation, unspecified: Secondary | ICD-10-CM

## 2012-08-09 DIAGNOSIS — N8501 Benign endometrial hyperplasia: Secondary | ICD-10-CM | POA: Insufficient documentation

## 2012-08-09 DIAGNOSIS — N939 Abnormal uterine and vaginal bleeding, unspecified: Secondary | ICD-10-CM | POA: Insufficient documentation

## 2012-08-09 NOTE — Progress Notes (Signed)
Subjective:    Patient ID: Jamie [name], female    DOB: 09-Nov-1965, 47 y.o.   MRN: [id number]  HPI  47 yo [id number] with LMP 07/26/2012 and BMI 30 presenting today as an ED referral for evaluation of heavy vaginal bleeding. Patient was recently admitted secondary to iron deficiency anemia and received a blood transfusion. An ultrasound performed demonstrated an 11-week size uterus with a 17 mm endometrial lining. Patient reports monthly menstrual cycles lasing 5-8 days associated with heavy vaginal bleeding and occasionally passage of clots. She is using tubal ligation for birth control. While being hospitalized, her synthroid was adjusted.  Past Medical History  Diagnosis Date  . Thyroid disease     hypothyroid  . Rh negative status during pregnancy 06/26/2010  . Herpes simplex without mention of complication   . Hypercholesteremia   . HTN (hypertension)   . GERD (gastroesophageal reflux disease)    Past surgical history - Cesarean section with bilateral tubal ligation  Family History  Problem Relation Age of Onset  . Heart disease Father   . Diabetes Sister   . Diabetes Sister    History  Substance Use Topics  . Smoking status: Never Smoker   . Smokeless tobacco: Not on file  . Alcohol Use: No     Review of Systems  All other systems reviewed and are negative.       Objective:   Physical Exam GENERAL: Well-developed, well-nourished female in no acute distress.  ABDOMEN: Soft, nontender, obese. PELVIC: Normal external female genitalia. Vagina is pink and rugated.  Normal discharge. Normal appearing cervix. Bimanual exam limited secondary to body habitus. No adnexal mass or tenderness. EXTREMITIES: No cyanosis, clubbing, or edema, 2+ distal pulses.   08/07/2012 IMPRESSION:  1. Enlarged uterus with heterogeneous myometrium. Single distinct  fibroid in the right inferior corpus measuring 3.3 cm.  2. Endometrial thickness measures 17 mm. If bleeding remains   unresponsive to hormonal or medical therapy, focal lesion work-up  with sonohysterogram should be considered. Endometrial biopsy  should also be considered in pre-menopausal patients at high risk  for endometrial carcinoma. (Ref: Radiological Reasoning:  Algorithmic Workup of Abnormal Vaginal Bleeding with Endovaginal  Sonography and Sonohysterography. AJR 2008; 213:Y86-57)  3. 2.3 cm hemorrhagic left ovarian cyst with benign  characteristics.  4. Normal appearance of right ovary.     Assessment & Plan:  47 yo with abnormal uterine bleeding and thickened endometrium - Patient counseled on need for endometrial biopsy ENDOMETRIAL BIOPSY     The indications for endometrial biopsy were reviewed.   Risks of the biopsy including cramping, bleeding, infection, uterine perforation, inadequate specimen and need for additional procedures  were discussed. The patient states she understands and agrees to undergo procedure today. Consent was signed. Time out was performed. Urine HCG was negative. A sterile speculum was placed in the patient's vagina and the cervix was prepped with Betadine. A single-toothed tenaculum was placed on the anterior lip of the cervix to stabilize it. The uterine cavity was sounded to a depth of 12 cm using the uterine sound. The 3 mm pipelle was introduced into the endometrial cavity without difficulty, 2 passes were made.  A  moderate amount of tissue was  sent to pathology. The instruments were removed from the patient's vagina. Minimal bleeding from the cervix was noted. The patient tolerated the procedure well.  Routine post-procedure instructions were given to the patient. The patient will follow up in two weeks to review the results and for further management.   -  Discussed medical management with Mirena IUD or depo-provera as well as surgical management with endometrial ablation. Patient is interested in Mirena IUD for now. - RTC in 2 weeks for results and IUD insertion

## 2012-08-09 NOTE — Patient Instructions (Signed)
Levonorgestrel intrauterine device (IUD) Qu es este medicamento? El LEVONORGESTREL (DIU) es un dispositivo anticonceptivo (control de natalidad). Se utiliza para evitar el embarazo durante un perodo de hasta 5 aos. Este medicamento puede ser utilizado para otros usos; si tiene alguna pregunta consulte con su proveedor de atencin mdica o con su farmacutico. Qu le debo informar a mi profesional de la salud antes de tomar este medicamento? Necesita saber si usted presenta alguno de los siguientes problemas o situaciones: -exmen de Papanicolaou anormal -cncer de mama, cuello del tero o tero -diabetes -endometritis -si tiene una infeccin plvica o genital actual o en el pasado -tiene ms de una pareja sexual o si su pareja tiene ms de una pareja -enfermedad cardiaca -antecedente de embarazo tubrico o ectpico -problemas del sistema inmunolgico -DIU colocado -enfermedad heptica o tumor del hgado -problemas con la coagulacin o si toma diluyentes sanguneos -usa medicamentos intravenoso -forma inusual del tero -sangrado vaginal que no tiene explicacin -una reaccin alrgica o inusual al levonorgestrel, a otras hormonas, a la silicona o polietilenos, a otros medicamentos, alimentos, colorantes o conservantes -si est embarazada o buscando quedar embarazada -si est amamantando a un beb Cmo debo utilizar este medicamento? Un profesional de la salud coloca este dispositivo en el tero. Hable con su pediatra para informarse acerca del uso de este medicamento en nios. Puede requerir atencin especial. Sobredosis: Pngase en contacto inmediatamente con un centro toxicolgico o una sala de urgencia si usted cree que haya tomado demasiado medicamento. ATENCIN: Este medicamento es solo para usted. No comparta este medicamento con nadie. Qu sucede si me olvido de una dosis? No se aplica en este caso. Qu puede interactuar con este medicamento? No tome esta medicina con  ninguno de los siguientes medicamentos: -amprenavir -bosentano -fosamprenavir Esta medicina tambin puede interactuar con los siguientes medicamentos: -aprepitant -barbitricos para producir el sueo o para el tratamiento de convulsiones -bexaroteno -griseofulvina -medicamentos para tratar los convulsiones, tales como carbamazepina, etotona, felbamato, oxcarbazepina, fenitona, topiramato -modafinilo -pioglitazona -rifabutina -rifampicina -rifapentina -algunos medicamentos para tratar el virus VIH, tales como atazanavir, indinavir, lopinavir, nelfinavir, tipranavir, ritonavir -hierba de [place] -warfarina Puede ser que esta lista no menciona todas las posibles interacciones. Informe a su profesional de la salud de todos los productos a base de hierbas, medicamentos de venta libre o suplementos nutritivos que est tomando. Si usted fuma, consume bebidas alcohlicas o si utiliza drogas ilegales, indqueselo tambin a su profesional de la salud. Algunas sustancias pueden interactuar con su medicamento. A qu debo estar atento al usar este medicamento? Visite a su mdico o a su profesional de la salud para chequear su evolucin peridicamente. Visite a su mdico si usted o su pareja tiene relaciones sexuales con otras personas, se vuelve VIH positivo o contrae una enfermedad de transmisin sexual. Este medicamento no la protege de la infeccin por VIH (SIDA) ni de ninguna otra enfermedad de transmisin sexual. Puede controlar la ubicacin del DIU usted misma palpando con sus dedos limpios los hilos en la parte anterior de la vagina. No tire de los hilos. Es un buen hbito controlar la ubicacin del dispositivo despus de cada perodo menstrual. Si no slo siente los hilos sino que adems siente otra parte ms del DIU o si no puede sentir los hilos, consulte a su mdico inmediatamente. El DIU puede salirse por s solo. Puede quedar embarazada si el dispositivo se sale de su lugar. Utilice un  mtodo anticonceptivo adicional, como preservativos, y consulte a su proveedor de atencin mdica s observa que   el DIU se sali de su lugar. La utilizacin de tampones no cambia la posicin del DIU y no hay inconvenientes en usarlos durante su perodo. Qu efectos secundarios puedo tener al utilizar este medicamento? Efectos secundarios que debe informar a su mdico o a su profesional de la salud tan pronto como sea posible: -reacciones alrgicas como erupcin cutnea, picazn o urticarias, hinchazn de la cara, labios o lengua -fiebre, sntomas gripales -llagas genitales -alta presin sangunea -ausencia de un perodo menstrual durante 6 semanas mientras lo utiliza -dolor, hinchazn o calor en las piernas -dolor o sensibilidad del plvico -dolor de cabeza repentino o severo -signos de embarazo -calambres estomacales -falta de aliento repentina -problemas de coordinacin, del habla, al caminar -sangrado, flujo vaginal inusual -color amarillento de los ojos o la piel Efectos secundarios que, por lo general, no requieren atencin mdica (debe informarlos a su mdico o a su profesional de la salud si persisten o si son molestos): -acn -dolor de pecho -cambios en el deseo sexual o capacidad -cambios de peso -calambres, mareos o sensacin de desmayo mientras se introduce el dispositivo -dolor de cabeza -sangrado menstruales irregulares en los primeros 3 a 6 meses de usar -nuseas Puede ser que esta lista no menciona todos los posibles efectos secundarios. Comunquese a su mdico por asesoramiento mdico sobre los efectos secundarios. Usted puede informar los efectos secundarios a la FDA por telfono al 1-800-FDA-1088. Dnde debo guardar mi medicina? No se aplica en este caso. ATENCIN: Este folleto es un resumen. Puede ser que no cubra toda la posible informacin. Si usted tiene preguntas acerca de esta medicina, consulte con su mdico, su farmacutico o su profesional de la salud.   2012, Elsevier/Gold Standard. (07/24/2008 3:17:12 PM) 

## 2012-08-18 ENCOUNTER — Encounter: Payer: Self-pay | Admitting: *Deleted

## 2012-09-02 ENCOUNTER — Ambulatory Visit (INDEPENDENT_AMBULATORY_CARE_PROVIDER_SITE_OTHER): Payer: Self-pay | Admitting: Obstetrics and Gynecology

## 2012-09-02 ENCOUNTER — Other Ambulatory Visit: Payer: Self-pay | Admitting: Obstetrics and Gynecology

## 2012-09-02 ENCOUNTER — Encounter: Payer: Self-pay | Admitting: Obstetrics and Gynecology

## 2012-09-02 VITALS — BP 120/78 | HR 93 | Temp 97.3°F | Wt 171.9 lb

## 2012-09-02 DIAGNOSIS — Z01812 Encounter for preprocedural laboratory examination: Secondary | ICD-10-CM

## 2012-09-02 DIAGNOSIS — N926 Irregular menstruation, unspecified: Secondary | ICD-10-CM

## 2012-09-02 DIAGNOSIS — N939 Abnormal uterine and vaginal bleeding, unspecified: Secondary | ICD-10-CM

## 2012-09-02 DIAGNOSIS — Z30430 Encounter for insertion of intrauterine contraceptive device: Secondary | ICD-10-CM

## 2012-09-02 MED ORDER — LEVONORGESTREL 20 MCG/24HR IU IUD
INTRAUTERINE_SYSTEM | Freq: Once | INTRAUTERINE | Status: AC
  Administered 2012-09-02: 16:00:00 via INTRAUTERINE

## 2012-09-02 NOTE — Addendum Note (Signed)
Addended by: Gerome Apley on: 09/02/2012 02:59 PM   Modules accepted: Orders

## 2012-09-02 NOTE — Progress Notes (Signed)
Patient ID: Jamie [name], female   DOB: [birth year], 47 y.o.   MRN: [id number] 47 yo [id number] presenting today for IUD insertion for management of abnormal uterine bleeding. Endometrial biospy performed on 3/17 demonstrated: - BENIGN POLYPOID PROLIFERATIVE PATTERN ENDOMETRIUM, SEE COMMENT. - NO HYPERPLASIA, ATYPIA, OR MALIGNANCY IDENTIFIED.  Results were reviewed and explained to the patient.  IUD Procedure Note Patient identified, informed consent performed, signed copy in chart, time out was performed.  Urine pregnancy test negative.  Speculum placed in the vagina.  Cervix visualized.  Cleaned with Betadine x 2.  Grasped anteriorly with a single tooth tenaculum.  Uterus sounded to 12 cm.  Mirena IUD placed per manufacturer's recommendations.  Strings trimmed to 3 cm. Tenaculum was removed, good hemostasis noted.  Patient tolerated procedure well.   Patient given post procedure instructions and Mirena care card with expiration date.  Patient is asked to check IUD strings periodically and follow up in 4-6 weeks for IUD check.

## 2012-10-04 ENCOUNTER — Ambulatory Visit (INDEPENDENT_AMBULATORY_CARE_PROVIDER_SITE_OTHER): Payer: Self-pay | Admitting: Obstetrics and Gynecology

## 2012-10-04 VITALS — BP 126/82 | HR 76 | Wt 172.1 lb

## 2012-10-04 DIAGNOSIS — Z30431 Encounter for routine checking of intrauterine contraceptive device: Secondary | ICD-10-CM

## 2012-10-04 NOTE — Progress Notes (Signed)
Patient ID: Jamie [name], female   DOB: 1966/02/27, 47 y.o.   MRN: [id number] 47 yo [id number] s/p IUD placement on 09/02/2012 presenting today for string check. Patient reports significant improvement in her vaginal bleeding which is reduced to occasional spotting  SSE: IUD strings visualized extending 2 cm from os  A/P 48 yo here for IUD check - Patient doing well - Advised to continue monitoring bleeding pattern - mammogram scholarship fund application completed - RTC for persistent abnormal uterine bleeding

## 2013-04-11 ENCOUNTER — Encounter: Payer: Self-pay | Admitting: Internal Medicine

## 2013-04-11 ENCOUNTER — Ambulatory Visit: Payer: Self-pay | Attending: Internal Medicine | Admitting: Internal Medicine

## 2013-04-11 VITALS — BP 147/82 | HR 90 | Temp 98.0°F | Ht 63.0 in | Wt 181.2 lb

## 2013-04-11 DIAGNOSIS — E039 Hypothyroidism, unspecified: Secondary | ICD-10-CM

## 2013-04-11 DIAGNOSIS — I10 Essential (primary) hypertension: Secondary | ICD-10-CM | POA: Insufficient documentation

## 2013-04-11 DIAGNOSIS — D509 Iron deficiency anemia, unspecified: Secondary | ICD-10-CM | POA: Insufficient documentation

## 2013-04-11 DIAGNOSIS — E669 Obesity, unspecified: Secondary | ICD-10-CM | POA: Insufficient documentation

## 2013-04-11 LAB — LIPID PANEL
Cholesterol: 238 mg/dL — ABNORMAL HIGH (ref 0–200)
VLDL: 59 mg/dL — ABNORMAL HIGH (ref 0–40)

## 2013-04-11 LAB — CMP AND LIVER
Bilirubin, Direct: 0.2 mg/dL (ref 0.0–0.3)
CO2: 27 mEq/L (ref 19–32)
Calcium: 9.2 mg/dL (ref 8.4–10.5)
Chloride: 106 mEq/L (ref 96–112)
Creat: 0.73 mg/dL (ref 0.50–1.10)
Glucose, Bld: 136 mg/dL — ABNORMAL HIGH (ref 70–99)
Indirect Bilirubin: 1 mg/dL — ABNORMAL HIGH (ref 0.0–0.9)
Sodium: 139 mEq/L (ref 135–145)
Total Bilirubin: 1.2 mg/dL (ref 0.3–1.2)
Total Protein: 6.7 g/dL (ref 6.0–8.3)

## 2013-04-11 LAB — CBC WITH DIFFERENTIAL/PLATELET
Eosinophils Absolute: 0.1 10*3/uL (ref 0.0–0.7)
Eosinophils Relative: 1 % (ref 0–5)
Hemoglobin: 13.9 g/dL (ref 12.0–15.0)
Lymphs Abs: 1.4 10*3/uL (ref 0.7–4.0)
MCH: 34.2 pg — ABNORMAL HIGH (ref 26.0–34.0)
MCV: 95.1 fL (ref 78.0–100.0)
Monocytes Relative: 6 % (ref 3–12)
RBC: 4.07 MIL/uL (ref 3.87–5.11)

## 2013-04-11 MED ORDER — LISINOPRIL 10 MG PO TABS: 10.0000 mg | ORAL_TABLET | Freq: Every day | ORAL | Status: DC

## 2013-04-11 MED ORDER — RANITIDINE HCL 300 MG PO TABS: 300.0000 mg | ORAL_TABLET | Freq: Two times a day (BID) | ORAL | Status: DC

## 2013-04-11 MED ORDER — FERROUS SULFATE 325 (65 FE) MG PO TABS: 325.0000 mg | ORAL_TABLET | Freq: Two times a day (BID) | ORAL | Status: DC

## 2013-04-11 MED ORDER — LEVOTHYROXINE SODIUM 50 MCG PO TABS: 50.0000 ug | ORAL_TABLET | Freq: Every day | ORAL | Status: DC

## 2013-04-11 NOTE — Progress Notes (Signed)
Patient ID: Jamie [name], female   DOB: 1965-12-04, 47 y.o.   MRN: [id number] Patient Demographics  Jamie [name], is a 47 y.o. female  WUJ:[id number]  [id number]  DOB - April 27, 1966  CC:  Chief Complaint  Patient presents with  . new patient  . lump on Lt side of hip/stomach?    x3-4 months, no pain       HPI: Jamie [name] is a 46 y.o. female here today to establish medical care. She has history of hypothyroidism, dyslipidemia, hypertension and GERD. She is on multiple medications as listed below. She has no significant complaint today, she only wants a refill of her medications because she's been out for a while. She noticed a lump on the right side of her low abdomen which has been there for almost 3-4 months, sometimes she can feel it sometimes she cannot. No pain associated. She thinks it may be fact. No change in bowel habit.  Patient has No headache, No chest pain, No abdominal pain - No Nausea, No new weakness tingling or numbness, No Cough - SOB.  No Known Allergies Past Medical History  Diagnosis Date  . Thyroid disease     hypothyroid  . Rh negative status during pregnancy 06/26/2010  . Herpes simplex without mention of complication   . Hypercholesteremia   . HTN (hypertension)   . GERD (gastroesophageal reflux disease)    Current Outpatient Prescriptions on File Prior to Visit  Medication Sig Dispense Refill  . levonorgestrel (MIRENA) 20 MCG/24HR IUD 1 each by Intrauterine route once.       No current facility-administered medications on file prior to visit.   Family History  Problem Relation Age of Onset  . Heart disease Father   . Diabetes Sister   . Diabetes Sister    History   Social History  . Marital Status: Married    Spouse Name: N/A    Number of Children: N/A  . Years of Education: N/A   Occupational History  . Not on file.   Social History Main Topics  . Smoking status: Never Smoker   . Smokeless tobacco: Never  Used  . Alcohol Use: No  . Drug Use: No  . Sexual Activity: Yes    Birth Control/ Protection: Surgical   Other Topics Concern  . Not on file   Social History Narrative  . No narrative on file    Review of Systems: Constitutional: Negative for fever, chills, diaphoresis, activity change, appetite change and fatigue. HENT: Negative for ear pain, nosebleeds, congestion, facial swelling, rhinorrhea, neck pain, neck stiffness and ear discharge.  Eyes: Negative for pain, discharge, redness, itching and visual disturbance. Respiratory: Negative for cough, choking, chest tightness, shortness of breath, wheezing and stridor.  Cardiovascular: Negative for chest pain, palpitations and leg swelling. Gastrointestinal: Negative for abdominal distention. Genitourinary: Negative for dysuria, urgency, frequency, hematuria, flank pain, decreased urine volume, difficulty urinating and dyspareunia.  Musculoskeletal: Negative for back pain, joint swelling, arthralgia and gait problem. Neurological: Negative for dizziness, tremors, seizures, syncope, facial asymmetry, speech difficulty, weakness, light-headedness, numbness and headaches.  Hematological: Negative for adenopathy. Does not bruise/bleed easily. Psychiatric/Behavioral: Negative for hallucinations, behavioral problems, confusion, dysphoric mood, decreased concentration and agitation.    Objective:   Filed Vitals:   04/11/13 1522  BP: 147/82  Pulse: 90  Temp: 98 F (36.7 C)    Physical Exam: Constitutional: Patient appears well-developed and well-nourished. No distress. HENT: Normocephalic, atraumatic, External right and left ear normal. Oropharynx is clear and moist.  Eyes: Conjunctivae and EOM are normal. PERRLA, no scleral icterus. Neck: Normal ROM. Neck supple. No JVD. No tracheal deviation. No thyromegaly. CVS: RRR, S1/S2 +, no murmurs, no gallops, no carotid bruit.  Pulmonary: Effort and breath sounds normal, no stridor, rhonchi,  wheezes, rales.  Abdominal: Soft. BS +, no distension, tenderness, rebound or guarding.  Musculoskeletal: Normal range of motion. No edema and no tenderness.  Lymphadenopathy: No lymphadenopathy noted, cervical, inguinal or axillary Neuro: Alert. Normal reflexes, muscle tone coordination. No cranial nerve deficit. Skin: Skin is warm and dry. No rash noted. Not diaphoretic. No erythema. No pallor. Psychiatric: Normal mood and affect. Behavior, judgment, thought content normal.  Lab Results  Component Value Date   WBC 6.6 08/07/2012   HGB 9.7* 08/07/2012   HCT 29.7* 08/07/2012   MCV 66.8* 08/07/2012   PLT 248 08/07/2012   Lab Results  Component Value Date   CREATININE 0.86 08/07/2012   BUN 11 08/07/2012   NA 139 08/07/2012   K 3.6 08/07/2012   CL 106 08/07/2012   CO2 23 08/07/2012    No results found for this basename: HGBA1C   Lipid Panel     Component Value Date/Time   CHOL 222* 02/01/2009 0005   TRIG 105 02/01/2009 0005   HDL 64 02/01/2009 0005   CHOLHDL 3.5 Ratio 02/01/2009 0005   VLDL 21 02/01/2009 0005   LDLCALC 137* 02/01/2009 0005       Assessment and plan:   Patient Active Problem List   Diagnosis Date Noted  . Essential hypertension, benign 04/11/2013  . Anemia, iron deficiency 04/11/2013  . Obesity (BMI 30-39.9) 04/11/2013  . Abnormal uterine bleeding 08/09/2012  . Anemia 08/06/2012  . HTN (hypertension) 08/06/2012  . Tinea corporis 10/28/2010  . Pregnancy, supervision of normal 07/03/2010  . Rh negative status during pregnancy 06/26/2010  . UNSPECIFIED GENITAL HERPES 03/28/2010  . Unspecified hypothyroidism 03/28/2010    Plan: Refill the following medications:  Ferrous sulfate 325 mg tablet by mouth daily  Lisinopril 10 mg tablet by mouth daily  Levothyroxine 50 mcg tablet by mouth daily  Ranitidine 300 mg tablet by mouth twice a day  Labs:  Comprehensive metabolic panel  Complete blood count and differential  Lipid panel  Thyroid function  test  Complete urinalysis  Hemoglobin A1c  Patient was extensively counseled about nutrition and exercise    Follow up in 3 months or when necessary  Interpreter was used to communicate directly with patient for the entire encounter including providing detailed patient instructions.    The patient was given clear instructions to go to ER or return to medical center if symptoms don't improve, worsen or new problems develop. The patient verbalized understanding. The patient was told to call to get lab results if they haven't heard anything in the next week.     Jeanann Lewandowsky, MD, MHA, [facility] [place], [place] [phone]   04/11/2013, 3:51 PM

## 2013-04-11 NOTE — Progress Notes (Signed)
Pt is here to establish care. Pt is complaining of a lump on the LT side of her hip/abdomen.; x3 to 4 months, currently no pain.

## 2013-04-11 NOTE — Patient Instructions (Signed)
Hypertension As your heart beats, it forces blood through your arteries. This force is your blood pressure. If the pressure is too high, it is called hypertension (HTN) or high blood pressure. HTN is dangerous because you may have it and not know it. High blood pressure may mean that your heart has to work harder to pump blood. Your arteries may be narrow or stiff. The extra work puts you at risk for heart disease, stroke, and other problems.  Blood pressure consists of two numbers, a higher number over a lower, 110/72, for example. It is stated as "110 over 72." The ideal is below 120 for the top number (systolic) and under 80 for the bottom (diastolic). Write down your blood pressure today. You should pay close attention to your blood pressure if you have certain conditions such as:  Heart failure.  Prior heart attack.  Diabetes  Chronic kidney disease.  Prior stroke.  Multiple risk factors for heart disease. To see if you have HTN, your blood pressure should be measured while you are seated with your arm held at the level of the heart. It should be measured at least twice. A one-time elevated blood pressure reading (especially in the Emergency Department) does not mean that you need treatment. There may be conditions in which the blood pressure is different between your right and left arms. It is important to see your caregiver soon for a recheck. Most people have essential hypertension which means that there is not a specific cause. This type of high blood pressure may be lowered by changing lifestyle factors such as:  Stress.  Smoking.  Lack of exercise.  Excessive weight.  Drug/tobacco/alcohol use.  Eating less salt. Most people do not have symptoms from high blood pressure until it has caused damage to the body. Effective treatment can often prevent, delay or reduce that damage. TREATMENT  When a cause has been identified, treatment for high blood pressure is directed at the  cause. There are a large number of medications to treat HTN. These fall into several categories, and your caregiver will help you select the medicines that are best for you. Medications may have side effects. You should review side effects with your caregiver. If your blood pressure stays high after you have made lifestyle changes or started on medicines,   Your medication(s) may need to be changed.  Other problems may need to be addressed.  Be certain you understand your prescriptions, and know how and when to take your medicine.  Be sure to follow up with your caregiver within the time frame advised (usually within two weeks) to have your blood pressure rechecked and to review your medications.  If you are taking more than one medicine to lower your blood pressure, make sure you know how and at what times they should be taken. Taking two medicines at the same time can result in blood pressure that is too low. SEEK IMMEDIATE MEDICAL CARE IF:  You develop a severe headache, blurred or changing vision, or confusion.  You have unusual weakness or numbness, or a faint feeling.  You have severe chest or abdominal pain, vomiting, or breathing problems. MAKE SURE YOU:   Understand these instructions.  Will watch your condition.  Will get help right away if you are not doing well or get worse. Document Released: 05/12/2005 Document Revised: 08/04/2011 Document Reviewed: 12/31/2007 [facility] Patient Information 2014 [place], [place]. La dieta DASH  (DASH Diet) La dieta DASH (por su nombre en ingls) significa "Abordaje  diettico para detener la hipertensin". Es un plan de alimentacin saludable que ha demostrado reducir la presin arterial elevada (hipertensin) en tan solo [address] mientras probablemente tambin ofrezca otros beneficios significativos para la salud. Estos otros beneficios incluyen la reduccin del riesgo de sufrir cncer de mama despus de la menopausia y de sufrir diabetes  tipo 2, enfermedades cardacas, cncer de colon e ictus. Los beneficios para la salud tambin incluyen la prdida de peso y la disminucin del riesgo de insuficiencia renal en pacientes con enfermedad renal crnica.  GUAS PARA LA DIETA   Limite la cantidad de sal (sodio). Su dieta debe contener menos de 1500 mg de [employer].  Limite el consumo de carbohidratos refinados o procesados. Su dieta debe incluir principalmente granos enteros. Consuma postres con moderacin y limite el agregado de International aid/development worker.  Incluya pequeas cantidades de grasas saludables para el corazn. Estos tipos de grasas estn contenidas en las nueces, aceites y [place]. Limite las grasas saturadas y trans. Estas grasas han demostrado ser perjudiciales para el organismo. ELECCIN DE LOS ALIMENTOS  Los siguientes grupos de alimentos se basan en una dieta de 2000 caloras. Consulte a su nutricionista matriculado cules son sus necesidades calricas individuales.  Granos y productos elaborados con granos (6 a 8 porciones por Futures trader).   Consuma ms a menudo: Pan integral, arroz integral, pasta de trigo o de [place] integral, quinoa, palomitas de maz sin grasa o sal aadida (infladas).  Consuma con menos frecuencia:  Pan blanco, pastas blancas, arroz blanco, pan de maz. Verduras (4 a 5 porciones por Futures trader).   Consuma ms a menudo: Hortalizas frescas, congeladas y Primary school teacher. Puede consumir las hortalizas crudas, al vapor, asadas o grilladas con una mnima cantidad de grasas.  Consuma con menos frecuencia o evite: Vegetales con crema o fritos. Verduras en salsa de [place]. Frutas (4 a 5 porciones por da).  Consuma ms a menudo: Frutas frescas, en conserva (en su jugo natural) o frutas congeladas. Frutas secas sin el agregado de azcar. Cien por ciento jugo de fruta ( taza [237 ml] por da).  Consuma con menos frecuencia: Frutas secas con azcar agregado. Fruta enlatada en almbar light o espeso. Carnes magras, pescado y aves de  corral (2 porciones o Cabin crew. Una porcin es de 3 a 4 oz. [16-109 g]).  Consuma ms a menudo: Carne molida 90% magra o ms de lomo o solomillo . Cortes redondos de carne, [place] de pollo y de [place]. Todos los pescados. Prepare la carne al horno o asada a la parrilla o al grill. Nada debe ser frito.  Consuma con menos frecuencia o evite: Cortes grasos de carne, [place] o pata, muslo o ala de pollo. Cortes de carne o pescado fritos. Lcteos (2 a 3 porciones)  Consuma ms a menudo: La [employer] o con bajo contenido de grasa, yogur descremado o semidescremado, queso con bajo contenido en grasa o parcialmente descremado.  Consuma con menos frecuencia o evite: Leche (entera, al 2%). Yogur de [employer]. Quesos con toda su grasa. Nueces, semillas y legumbres (4 a 5 porciones por semana).  Consuma ms a menudo: Todos los alimentos sin sal agregada.  Consuma con menos frecuencia o evite: Nueces y semillas con sal, frijoles enlatados con sal agregada. Grasas y dulces (limitados)  Consuma ms a menudo: Public affairs consultant, margarinas en pote sin grasas trans, gelatina sin azcar. Mayonesa y aderezos para Solicitor.  Consuma con menos frecuencia o evite: Aceites de coco, aceites de palma, mantequilla, margarina en barra,  crema, mitad leche y [address] crema, [place], [place], [place]. Irven Shelling [place] INFORMACIN  El Dash Diet Eating Plan (Plan Nutricional Dieta Dash): www.dashdiet.org  Document Released: 05/01/2011 Document Revised: 08/04/2011 [facility] Patient Information 2014 [place], [place].

## 2013-04-12 LAB — URINALYSIS, COMPLETE
Bilirubin Urine: NEGATIVE
Crystals: NONE SEEN
Leukocytes, UA: NEGATIVE
Nitrite: NEGATIVE
Specific Gravity, Urine: 1.02 (ref 1.005–1.030)
Squamous Epithelial / LPF: NONE SEEN
Urobilinogen, UA: 1 mg/dL (ref 0.0–1.0)

## 2013-06-03 ENCOUNTER — Encounter: Payer: Self-pay | Admitting: Internal Medicine

## 2013-06-03 ENCOUNTER — Ambulatory Visit: Payer: Self-pay | Attending: Internal Medicine | Admitting: Internal Medicine

## 2013-06-03 VITALS — BP 147/83 | HR 90 | Temp 98.3°F | Resp 16 | Wt 179.0 lb

## 2013-06-03 DIAGNOSIS — D259 Leiomyoma of uterus, unspecified: Secondary | ICD-10-CM

## 2013-06-03 DIAGNOSIS — D219 Benign neoplasm of connective and other soft tissue, unspecified: Secondary | ICD-10-CM

## 2013-06-03 DIAGNOSIS — R300 Dysuria: Secondary | ICD-10-CM

## 2013-06-03 DIAGNOSIS — R1909 Other intra-abdominal and pelvic swelling, mass and lump: Secondary | ICD-10-CM | POA: Insufficient documentation

## 2013-06-03 DIAGNOSIS — R1900 Intra-abdominal and pelvic swelling, mass and lump, unspecified site: Secondary | ICD-10-CM

## 2013-06-03 LAB — POCT URINALYSIS DIPSTICK
Bilirubin, UA: NEGATIVE
Glucose, UA: NEGATIVE
Ketones, UA: NEGATIVE
LEUKOCYTES UA: NEGATIVE
NITRITE UA: NEGATIVE
PH UA: 5.5
Spec Grav, UA: >=1.03
UROBILINOGEN UA: 0.2

## 2013-06-03 NOTE — Progress Notes (Signed)
MRN: [id number] Name: Jamie [name]  Sex: female Age: 48 y.o. DOB: [birth year]  Allergies: Review of patient's allergies indicates no known allergies.  Chief Complaint  Patient presents with  . Cyst    HPI: Patient is 48 y.o. female who comes today reported to have noticed lump in her left lower groin area for the last 6 months sometimes it becomes painful denies any nausea vomiting or change in bowel habits, she reported some dysuria denies any fever chills, she also history of menorrhagia had ultrasound done in the past reported to have fibroid already following up with her OB/GYN has IUD in place, and still gets bleeding.   Past Medical History  Diagnosis Date  . Thyroid disease     hypothyroid  . Rh negative status during pregnancy 06/26/2010  . Herpes simplex without mention of complication   . Hypercholesteremia   . HTN (hypertension)   . GERD (gastroesophageal reflux disease)     History reviewed. No pertinent past surgical history.    Medication List       This list is accurate as of: 06/03/13  5:49 PM.  Always use your most recent med list.               ferrous sulfate 325 (65 FE) MG tablet  Take 1 tablet (325 mg total) by mouth 2 (two) times daily with a meal.     levonorgestrel 20 MCG/24HR IUD  Commonly known as:  MIRENA  1 each by Intrauterine route once.     levothyroxine 50 MCG tablet  Commonly known as:  SYNTHROID, LEVOTHROID  Take 1 tablet (50 mcg total) by mouth daily.     lisinopril 10 MG tablet  Commonly known as:  PRINIVIL,ZESTRIL  Take 1 tablet (10 mg total) by mouth daily.     ranitidine 300 MG tablet  Commonly known as:  ZANTAC  Take 1 tablet (300 mg total) by mouth 2 (two) times daily.        No orders of the defined types were placed in this encounter.    Immunization History  Administered Date(s) Administered  . Influenza Whole 05/02/2010    Family History  Problem Relation Age of Onset  . Heart disease Father   .  Diabetes Sister   . Diabetes Sister     History  Substance Use Topics  . Smoking status: Never Smoker   . Smokeless tobacco: Never Used  . Alcohol Use: No    Review of Systems  As noted in HPI  Filed Vitals:   06/03/13 1709  BP: 147/83  Pulse: 90  Temp: 98.3 F (36.8 C)  Resp: 16    Physical Exam  Physical Exam  Constitutional: No distress.  Eyes: EOM are normal. Pupils are equal, round, and reactive to light.  Cardiovascular: Normal rate and regular rhythm.   Pulmonary/Chest: Breath sounds normal. No respiratory distress. She has no wheezes. She has no rales.  Abdominal: Bowel sounds are normal. There is no rebound and no guarding.  Palpable small lump in left groin area nontender    CBC    Component Value Date/Time   WBC 9.4 04/11/2013 1555   RBC 4.07 04/11/2013 1555   RBC 3.41* 08/06/2012 2149   HGB 13.9 04/11/2013 1555   HCT 38.7 04/11/2013 1555   PLT 219 04/11/2013 1555   MCV 95.1 04/11/2013 1555   LYMPHSABS 1.4 04/11/2013 1555   MONOABS 0.6 04/11/2013 1555   EOSABS 0.1 04/11/2013 1555   BASOSABS 0.1 04/11/2013 1555  CMP     Component Value Date/Time   NA 139 04/11/2013 1555   K 3.8 04/11/2013 1555   CL 106 04/11/2013 1555   CO2 27 04/11/2013 1555   GLUCOSE 136* 04/11/2013 1555   BUN 16 04/11/2013 1555   CREATININE 0.73 04/11/2013 1555   CREATININE 0.86 08/07/2012 0430   CALCIUM 9.2 04/11/2013 1555   PROT 6.7 04/11/2013 1555   ALBUMIN 3.9 04/11/2013 1555   AST 17 04/11/2013 1555   ALT 11 04/11/2013 1555   ALKPHOS 57 04/11/2013 1555   BILITOT 1.2 04/11/2013 1555   GFRNONAA 79* 08/07/2012 0430   GFRAA >90 08/07/2012 0430    Lab Results  Component Value Date/Time   CHOL 238* 04/11/2013  3:55 PM    No components found with this basename: hga1c    Lab Results  Component Value Date/Time   AST 17 04/11/2013  3:55 PM    Assessment and Plan  Groin lump - Plan: US Pelvis Limited for further evaluation.  Dysuria - Plan: POCT urinalysis  dipstick negative for leukocyte esterase or nitrites  Fibroid Patient to follow with her OB/GYN.  Return in about 2 months (around 08/01/2013).  Lorayne Marek, MD

## 2013-06-03 NOTE — Progress Notes (Signed)
Here with interpreter Patient complains of cyst to lower left abd Uncomfortable at times when she bends over Had and IUD inserted in April and continues to bleed everyday Had a ultra sound done and was told she had something in her uterus but No one ever told her exactly what was going on

## 2013-06-06 ENCOUNTER — Telehealth: Payer: Self-pay

## 2013-06-06 NOTE — Telephone Encounter (Signed)
Interpreter line used Patient is aware of her [place] appointment scheduled 06/09/13 at 9 am

## 2013-06-09 ENCOUNTER — Ambulatory Visit (HOSPITAL_COMMUNITY)
Admission: RE | Admit: 2013-06-09 | Discharge: 2013-06-09 | Disposition: A | Discharge status: Home / Self Care | Payer: Self-pay | Source: Ambulatory Visit | Attending: Internal Medicine | Admitting: Internal Medicine

## 2013-06-09 ENCOUNTER — Telehealth: Payer: Self-pay

## 2013-06-09 DIAGNOSIS — R1909 Other intra-abdominal and pelvic swelling, mass and lump: Secondary | ICD-10-CM

## 2013-06-09 NOTE — Telephone Encounter (Signed)
Message copied by Dorothe Pea on Thu Jun 09, 2013  2:18 PM ------      Message from: Lorayne Marek      Created: Thu Jun 09, 2013  9:50 AM       Ultrasound report reviewed reported heterogenous solid 1.8 cm nodule within the subcutaneous fat, please call and let the patient know about the results and help patient to schedule appointment with surgery for biopsy or excision. ------

## 2013-06-09 NOTE — Telephone Encounter (Signed)
Interpreter line used Patient is aware of her [place] results Ambulatory referral to general surgeon place

## 2013-07-18 ENCOUNTER — Ambulatory Visit (INDEPENDENT_AMBULATORY_CARE_PROVIDER_SITE_OTHER): Payer: Self-pay | Admitting: General Surgery

## 2013-08-01 ENCOUNTER — Ambulatory Visit: Payer: Self-pay | Admitting: Internal Medicine

## 2013-09-20 ENCOUNTER — Encounter: Payer: Self-pay | Admitting: Family Medicine

## 2013-09-20 ENCOUNTER — Ambulatory Visit: Payer: No Typology Code available for payment source | Attending: Internal Medicine | Admitting: Family Medicine

## 2013-09-20 VITALS — BP 162/80 | HR 80 | Temp 97.8°F | Ht 63.0 in | Wt 188.2 lb

## 2013-09-20 DIAGNOSIS — Z5181 Encounter for therapeutic drug level monitoring: Secondary | ICD-10-CM | POA: Insufficient documentation

## 2013-09-20 DIAGNOSIS — B354 Tinea corporis: Secondary | ICD-10-CM

## 2013-09-20 DIAGNOSIS — I10 Essential (primary) hypertension: Secondary | ICD-10-CM | POA: Insufficient documentation

## 2013-09-20 DIAGNOSIS — E039 Hypothyroidism, unspecified: Secondary | ICD-10-CM | POA: Insufficient documentation

## 2013-09-20 MED ORDER — LEVOTHYROXINE SODIUM 75 MCG PO TABS: 75.0000 ug | ORAL_TABLET | Freq: Every day | ORAL | Status: DC

## 2013-09-20 MED ORDER — TERBINAFINE HCL 1 % EX CREA: 1.0000 "application " | TOPICAL_CREAM | Freq: Two times a day (BID) | CUTANEOUS | Status: DC

## 2013-09-20 NOTE — Patient Instructions (Signed)
Thank you for coming in today.  For Treatment you should:  You thyroid level is still low.  We will increase your thyroid medication.  I will prescribe you cream to use 2 times a day until resolved.    Follow up in 3 months or sooner if symptoms do not improve.

## 2013-09-20 NOTE — Progress Notes (Signed)
   Subjective:    Patient ID: Jamie [name], female    DOB: 02-15-1966, 48 y.o.   MRN: [id number] HYPOTHYROIDISM Disease Monitoring Weight changes: no  Skin Changes: no Palpitations: no Heat/Cold intolerance: no  Medication Monitoring Compliance:  yes   Last TSH:   Lab Results  Component Value Date   TSH 6.707* 04/11/2013   HPI HYPERTENSION Disease Monitoring Home BP Monitoring no Chest pain- no    Dyspnea- no Medications Compliance-  yes. Lightheadedness-  no  Edema- no ROS - See HPI  Skin Rash R buttock.  Scaly nontender has been there for weeks.  No other lesions.  No fever   PMH Lab Review   Potassium  Date Value Ref Range Status  04/11/2013 3.8  3.5 - 5.3 mEq/L Final     Sodium  Date Value Ref Range Status  04/11/2013 139  135 - 145 mEq/L Final     Creat  Date Value Ref Range Status  04/11/2013 0.73  0.50 - 1.10 mg/dL Final     Creatinine, Ser  Date Value Ref Range Status  08/07/2012 0.86  0.50 - 1.10 mg/dL Final            Review of Systems    .  Objective:   Physical Exam Neck:  No deformities, thyromegaly, masses, or tenderness noted.   Supple with full range of motion without pain. Right buttock has a tinea infection. Heart - Regular rate and rhythm.  No murmurs, gallops or rubs.    Lungs:  Normal respiratory effort, chest expands symmetrically. Lungs are clear to auscultation, no crackles or wheezes. Extremities:  No cyanosis, edema, or deformity noted with good range of motion of all major joints.         Assessment & Plan:

## 2013-09-21 NOTE — Assessment & Plan Note (Signed)
Tsh not at goal.  Increase replacement and recheck in 2 months

## 2013-09-21 NOTE — Assessment & Plan Note (Signed)
Treat with topical lamisil

## 2013-09-21 NOTE — Assessment & Plan Note (Addendum)
Near goal continue current medications and see if improves with thyroid correction

## 2013-10-25 ENCOUNTER — Inpatient Hospital Stay (HOSPITAL_COMMUNITY): Payer: No Typology Code available for payment source

## 2013-10-25 ENCOUNTER — Encounter (HOSPITAL_COMMUNITY): Payer: Self-pay | Admitting: *Deleted

## 2013-10-25 ENCOUNTER — Inpatient Hospital Stay (HOSPITAL_COMMUNITY)
Admission: AD | Admit: 2013-10-25 | Discharge: 2013-10-25 | Disposition: A | Discharge status: Home / Self Care | Payer: No Typology Code available for payment source | Source: Ambulatory Visit | Attending: Obstetrics & Gynecology | Admitting: Obstetrics & Gynecology

## 2013-10-25 DIAGNOSIS — I10 Essential (primary) hypertension: Secondary | ICD-10-CM | POA: Insufficient documentation

## 2013-10-25 DIAGNOSIS — R109 Unspecified abdominal pain: Secondary | ICD-10-CM | POA: Insufficient documentation

## 2013-10-25 DIAGNOSIS — D259 Leiomyoma of uterus, unspecified: Secondary | ICD-10-CM | POA: Insufficient documentation

## 2013-10-25 DIAGNOSIS — K219 Gastro-esophageal reflux disease without esophagitis: Secondary | ICD-10-CM | POA: Insufficient documentation

## 2013-10-25 DIAGNOSIS — N949 Unspecified condition associated with female genital organs and menstrual cycle: Secondary | ICD-10-CM | POA: Insufficient documentation

## 2013-10-25 DIAGNOSIS — N938 Other specified abnormal uterine and vaginal bleeding: Secondary | ICD-10-CM | POA: Insufficient documentation

## 2013-10-25 DIAGNOSIS — Z30431 Encounter for routine checking of intrauterine contraceptive device: Secondary | ICD-10-CM | POA: Insufficient documentation

## 2013-10-25 DIAGNOSIS — N939 Abnormal uterine and vaginal bleeding, unspecified: Secondary | ICD-10-CM

## 2013-10-25 DIAGNOSIS — E0500 Thyrotoxicosis with diffuse goiter without thyrotoxic crisis or storm: Secondary | ICD-10-CM | POA: Insufficient documentation

## 2013-10-25 LAB — CBC
HCT: 33.3 % — ABNORMAL LOW (ref 36.0–46.0)
HEMOGLOBIN: 11.9 g/dL — AB (ref 12.0–15.0)
MCH: 32.8 pg (ref 26.0–34.0)
MCHC: 35.7 g/dL (ref 30.0–36.0)
MCV: 91.7 fL (ref 78.0–100.0)
PLATELETS: 217 10*3/uL (ref 150–400)
RBC: 3.63 MIL/uL — AB (ref 3.87–5.11)
RDW: 11.9 % (ref 11.5–15.5)
WBC: 9.7 10*3/uL (ref 4.0–10.5)

## 2013-10-25 LAB — WET PREP, GENITAL
CLUE CELLS WET PREP: NONE SEEN
Trich, Wet Prep: NONE SEEN
WBC WET PREP: NONE SEEN
Yeast Wet Prep HPF POC: NONE SEEN

## 2013-10-25 LAB — POCT PREGNANCY, URINE: Preg Test, Ur: NEGATIVE

## 2013-10-25 MED ORDER — RANITIDINE HCL 300 MG PO TABS: 300.0000 mg | ORAL_TABLET | ORAL | Status: DC | PRN

## 2013-10-25 MED ORDER — MEGESTROL ACETATE 20 MG PO TABS: ORAL_TABLET | ORAL | Status: DC

## 2013-10-25 NOTE — MAU Note (Signed)
Has appt with surgeon on Monday, to evaluate  Fatty tumor- lower left abd.

## 2013-10-25 NOTE — MAU Note (Addendum)
Started bleeding a month ago,like a period.  Was light at first, now "too much blood"

## 2013-10-25 NOTE — MAU Provider Note (Signed)
CSN: [id number]     Arrival date & time 10/25/13  1239 History   None    Chief Complaint  Patient presents with  . Vaginal Bleeding     (Consider location/radiation/quality/duration/timing/severity/associated sxs/prior Treatment) Patient is a 48 y.o. female presenting with vaginal bleeding. The history is provided by the patient. The history is limited by a language barrier. A language interpreter was used.  Vaginal Bleeding This is a new problem. The current episode started more than 1 month ago. The problem occurs constantly. The problem has been gradually worsening. Associated symptoms include abdominal pain. Pertinent negatives include no nausea or vomiting. Nothing aggravates the symptoms. She has tried acetaminophen for the symptoms. The treatment provided significant relief.   Jamie [name] is a 48 y.o. [id number] who presents to the ED with vaginal bleeding that has been going on for over a month. She had had an IUD placed one year ago to try and decrease the amount of bleeding with her periods. She states that it did help but yesterday she started bleeding heavy and passing clots. She does not want the IUD removed because she is afraid the bleeding will get even worse than before. She reports mild lower abdominal pain that resolved with tylenol. She states that she has a lump in the lower left abdomen. She has an appointment with a surgeon next week. She feels a little tired but no weakness or dizziness. She denies any other problems today.   Past Medical History  Diagnosis Date  . Thyroid disease     hypothyroid  . Rh negative status during pregnancy 06/26/2010  . Herpes simplex without mention of complication   . Hypercholesteremia   . HTN (hypertension)   . GERD (gastroesophageal reflux disease)    Past Surgical History  Procedure Laterality Date  . Cesarean section    . Tubal ligation     Family History  Problem Relation Age of Onset  . Heart disease Father   .  Diabetes Sister   . Diabetes Sister    History  Substance Use Topics  . Smoking status: Never Smoker   . Smokeless tobacco: Never Used  . Alcohol Use: No   OB History   Grav Para Term Preterm Abortions TAB SAB Ect Mult Living   8 6 6  0 2 0 2 0 0 6     Review of Systems  Gastrointestinal: Positive for abdominal pain. Negative for nausea and vomiting.  Genitourinary: Positive for vaginal bleeding.  All other systems negative.     Allergies  Review of patient's allergies indicates no known allergies.  Home Medications   Prior to Admission medications   Medication Sig Start Date End Date Taking? Authorizing Provider  ferrous sulfate 325 (65 FE) MG tablet Take 1 tablet (325 mg total) by mouth 2 (two) times daily with a meal. 04/11/13   Angelica Chessman, MD  levonorgestrel (MIRENA) 20 MCG/24HR IUD 1 each by Intrauterine route once.    Historical Provider, MD  levothyroxine (SYNTHROID, LEVOTHROID) 75 MCG tablet Take 1 tablet (75 mcg total) by mouth daily. 09/20/13   Lind Covert, MD  lisinopril (PRINIVIL,ZESTRIL) 10 MG tablet Take 1 tablet (10 mg total) by mouth daily. 04/11/13   Angelica Chessman, MD  ranitidine (ZANTAC) 300 MG tablet Take 1 tablet (300 mg total) by mouth 2 (two) times daily. 04/11/13   Angelica Chessman, MD  terbinafine (LAMISIL) 1 % cream Apply 1 application topically 2 (two) times daily. 09/20/13   Lind Covert, MD  BP 116/71  Pulse 93  Temp(Src) 99.7 F (37.6 C) (Oral)  Resp 18  Ht 5\' 1"  (1.549 m)  Wt 183 lb (83.008 kg)  BMI 34.60 kg/m2  LMP 09/24/2013 Physical Exam  Nursing note and vitals reviewed. Constitutional: She is oriented to person, place, and time. She appears well-developed and well-nourished. No distress.  Eyes: EOM are normal.  Neck: Neck supple.  Cardiovascular: Normal rate.   Pulmonary/Chest: Effort normal.  Abdominal: Soft. Bowel sounds are normal. There is tenderness in the left lower quadrant.  Genitourinary:   External genitalia without lesions, cervix external os 1 cm. Unable to feel IUD string. No CMT, uterus without tenderness.   Musculoskeletal: Normal range of motion.  Neurological: She is alert and oriented to person, place, and time. No cranial nerve deficit.  Skin: Skin is warm and dry.  Psychiatric: She has a normal mood and affect. Her behavior is normal.   Results for orders placed during the hospital encounter of 10/25/13 (from the past 24 hour(s))  POCT PREGNANCY, URINE     Status: None   Collection Time    10/25/13  1:24 PM      Result Value Ref Range   Preg Test, Ur NEGATIVE  NEGATIVE  CBC     Status: Abnormal   Collection Time    10/25/13  1:39 PM      Result Value Ref Range   WBC 9.7  4.0 - 10.5 K/uL   RBC 3.63 (*) 3.87 - 5.11 MIL/uL   Hemoglobin 11.9 (*) 12.0 - 15.0 g/dL   HCT 33.3 (*) 36.0 - 46.0 %   MCV 91.7  78.0 - 100.0 fL   MCH 32.8  26.0 - 34.0 pg   MCHC 35.7  30.0 - 36.0 g/dL   RDW 11.9  11.5 - 15.5 %   Platelets 217  150 - 400 K/uL  WET PREP, GENITAL     Status: None   Collection Time    10/25/13  2:00 PM      Result Value Ref Range   Yeast Wet Prep HPF POC NONE SEEN  NONE SEEN   Trich, Wet Prep NONE SEEN  NONE SEEN   Clue Cells Wet Prep HPF POC NONE SEEN  NONE SEEN   WBC, Wet Prep HPF POC NONE SEEN  NONE SEEN    US Transvaginal Non-ob  10/25/2013   CLINICAL DATA:  Pelvic pain, IUD placement  EXAM: TRANSABDOMINAL AND TRANSVAGINAL ULTRASOUND OF PELVIS  TECHNIQUE: Both transabdominal and transvaginal ultrasound examinations of the pelvis were performed. Transabdominal technique was performed for global imaging of the pelvis including uterus, ovaries, adnexal regions, and pelvic cul-de-sac. It was necessary to proceed with endovaginal exam following the transabdominal exam to visualize the endometrium.  COMPARISON:  Prior pelvic ultrasound 08/07/2012  FINDINGS: Uterus  Measurements: 10.4 x 5.5 x 7.8 cm. Diffusely heterogeneous appearance of the myometrium. There  is a dominant heterogeneous hypoechoic fibroid exophytic (subserosal) from the uterine fundus which measures 4.3 x 4.9 x 3.8 cm. There is a smaller 1.6 x 1.1 x 1.4 cm intramural fibroid in the upper uterus.  Endometrium  Thickness: 8.2 mm. Heterogeneous endometrium with fluid in the canal. No IUD is identified.  Right ovary  Unable to visualize secondary to obscuring overlying bowel gas.  Left ovary  Measurements: 3.1 x 1.5 x 1.8 cm. Normal appearance/no adnexal mass.  Other findings  No free fluid.  IMPRESSION: 1. IUD not identified within the endometrial canal. 2. Heterogeneous endometrium with fluid in  the canal consistent with current bleeding/menses. 3. Fibroid uterus. The largest fibroid measures up to 4.9 cm and is exophytic (sub serosal) from the right aspect of the uterine fundus. 4. Unable to visualize the right ovary secondary to obscuring bowel gas.   Electronically Signed   By: Jacqulynn Cadet M.D.   On: 10/25/2013 17:00   US Pelvis Complete  10/25/2013   CLINICAL DATA:  Pelvic pain, IUD placement  EXAM: TRANSABDOMINAL AND TRANSVAGINAL ULTRASOUND OF PELVIS  TECHNIQUE: Both transabdominal and transvaginal ultrasound examinations of the pelvis were performed. Transabdominal technique was performed for global imaging of the pelvis including uterus, ovaries, adnexal regions, and pelvic cul-de-sac. It was necessary to proceed with endovaginal exam following the transabdominal exam to visualize the endometrium.  COMPARISON:  Prior pelvic ultrasound 08/07/2012  FINDINGS: Uterus  Measurements: 10.4 x 5.5 x 7.8 cm. Diffusely heterogeneous appearance of the myometrium. There is a dominant heterogeneous hypoechoic fibroid exophytic (subserosal) from the uterine fundus which measures 4.3 x 4.9 x 3.8 cm. There is a smaller 1.6 x 1.1 x 1.4 cm intramural fibroid in the upper uterus.  Endometrium  Thickness: 8.2 mm. Heterogeneous endometrium with fluid in the canal. No IUD is identified.  Right ovary  Unable to  visualize secondary to obscuring overlying bowel gas.  Left ovary  Measurements: 3.1 x 1.5 x 1.8 cm. Normal appearance/no adnexal mass.  Other findings  No free fluid.  IMPRESSION: 1. IUD not identified within the endometrial canal. 2. Heterogeneous endometrium with fluid in the canal consistent with current bleeding/menses. 3. Fibroid uterus. The largest fibroid measures up to 4.9 cm and is exophytic (sub serosal) from the right aspect of the uterine fundus. 4. Unable to visualize the right ovary secondary to obscuring bowel gas.   Electronically Signed   By: Jacqulynn Cadet M.D.   On: 10/25/2013 17:00   BP 104/61  Pulse 87  Temp(Src) 99.7 F (37.6 C) (Oral)  Resp 18  Ht 5\' 1"  (1.549 m)  Wt 183 lb (83.008 kg)  BMI 34.60 kg/m2  LMP 09/24/2013  ED Course  Procedures   MDM  48 y.o. female with abnormal vaginal bleeding x one month. Hx of same one year ago that required blood transfusion. IUD was helpful. US show no IUD. Uterine fibroids noted.  Appointment with [facility] Clinic this week for follow up. Stable for discharge with no hemorrhage or anemia at this time. Will start Megace. I have reviewed this patient's vital signs, nurses notes, appropriate labs and imaging.  Discussed with the patient using the Spanish translator and all questioned fully answered.    Medication List    STOP taking these medications       levonorgestrel 20 MCG/24HR IUD  Commonly known as:  MIRENA      TAKE these medications       megestrol 20 MG tablet  Commonly known as:  MEGACE  - Take two tablets (40 mg) three times per day time three days,   - then take two tablets (40 mg) two times per day time three days,   - then take two tablets (40 mg)once per day     ranitidine 300 MG tablet  Commonly known as:  ZANTAC  Take 1 tablet (300 mg total) by mouth as needed for heartburn.      ASK your doctor about these medications       levothyroxine 75 MCG tablet  Commonly known as:  SYNTHROID, LEVOTHROID  Take  1 tablet (75 mcg total) by  mouth daily.     lisinopril-hydrochlorothiazide 10-12.5 MG per tablet  Commonly known as:  PRINZIDE,ZESTORETIC  Take 1 tablet by mouth daily.

## 2013-10-25 NOTE — Discharge Instructions (Signed)
Your ultrasound today shows that your IUD is no longer in place. You have expelled the IUD at some point. You will need to follow up in the [facility] Friday 10/28/2013 at [place]. Start your medication to help slow the bleeding. Return as needed.   Sangrado uterino anormal (Abnormal Uterine Bleeding) Sangrado uterino anormal significa que hay un sangrado por la vagina que no es su perodo menstrual normal. Puede ser:  Prdidas de sangre o hemorragias entre los perodos.  Hemorragias luego de Best boy sexo [employer]).  Sangrado abundante o ms que lo habitual.  Perodos que duran ms que lo normal.  Sangrado luego de la menopausia. Hay muchos problemas que pueden ser la causa. El tratamiento depender de la causa del sangrado. Cualquier tipo de sangrado que no sea normal debe consultarse con el mdico.  CUIDADOS EN EL HOGAR Controle su afeccin para ver si hay cambios. Estas indicaciones podrn disminuir cualquier molestia que tenga:  No use tampones ni duchas vaginales o como le haya indicado el mdico.  Cambie los apsitos con frecuencia. Deber hacerse exmenes plvicos regulares y pruebas de [place]apan[place]olaou. Realice los estudios indicados segn le indique su mdico. SOLICITE AYUDA SI:  El sangrado dura ms de 1 semana.  Se siente mareada por momentos. SOLICITE AYUDA DE INMEDIATO SI:   Se desmaya.  Tiene que [employer] apsitos cada 15 a 30 minutos.  Siente dolor en el abdomen.  Tiene fiebre.  Se siente dbil o presenta sudoracin.  Elimina cogulos grandes por la vagina.  Siente Higher education careers adviser (nuseas) y devuelve (vomita). ASEGRESE DE QUE:  Comprende estas instrucciones.  Controlar su afeccin.  Recibir ayuda de inmediato si no mejora o si empeora. Document Released: 06/14/2010 Document Revised: 03/02/2013 [facility] Patient Information 2014 [place], [place].

## 2013-10-25 NOTE — MAU Note (Addendum)
Correction.  Has been having bleeding every day for a year, will stop for a few days and then resumes.  Got IUD to help with bleeding, is less than she had been having, last couple days it increased, passing fist sized clots

## 2013-10-26 LAB — GC/CHLAMYDIA PROBE AMP
CT PROBE, AMP APTIMA: NEGATIVE
GC Probe RNA: NEGATIVE

## 2013-10-28 ENCOUNTER — Ambulatory Visit (INDEPENDENT_AMBULATORY_CARE_PROVIDER_SITE_OTHER): Payer: No Typology Code available for payment source | Admitting: Obstetrics and Gynecology

## 2013-10-28 VITALS — BP 117/75 | HR 74 | Temp 97.9°F | Ht 62.0 in | Wt 186.8 lb

## 2013-10-28 DIAGNOSIS — N926 Irregular menstruation, unspecified: Secondary | ICD-10-CM

## 2013-10-28 DIAGNOSIS — N939 Abnormal uterine and vaginal bleeding, unspecified: Secondary | ICD-10-CM

## 2013-10-28 DIAGNOSIS — Z01419 Encounter for gynecological examination (general) (routine) without abnormal findings: Secondary | ICD-10-CM

## 2013-10-28 DIAGNOSIS — D252 Subserosal leiomyoma of uterus: Secondary | ICD-10-CM

## 2013-10-28 NOTE — Addendum Note (Signed)
Addended by: Michel Harrow on: 10/28/2013 12:08 PM   Modules accepted: Orders

## 2013-10-28 NOTE — Patient Instructions (Signed)
Fibroma uterino (Uterine Fibroid) Un fibroma uterino es un crecimiento (tumor) dentro del tero. Este tipo de tumor no es Radio broadcast assistant y no se extiende fuera del tero. Podr tener uno o varios fibromas. Los fibromas pueden variar en tamao, peso y TEFL teacher en que se desarrollan dentro del tero. Algunos pueden llegar a ser bastante grandes. La mayora de los fibromas no necesitan tratamiento mdico, pero algunos pueden causar dolor o sangrado abundante durante los perodos y [facility]. CAUSAS  Un fibroma es el resultado del desarrollo continuo de una nica clula uterina que sigue creciendo (no regulada) que es diferente al resto de las clulas del cuerpo humano. La mayora de las clulas tiene un mecanismo de control que evita que se reproduzcan de Research officer, trade union.  SIGNOS Y SNTOMAS   Hemorragias.  Dolor y sensacin de presin en la pelvis.  Problemas en la vejiga debido al tamao del fibroma.  Infertilidad y abortos espontneos, segn el tamao y la ubicacin del fibroma. DIAGNSTICO  Los fibromas uterinos se diagnostican con un examen fsico. El mdico puede palpar los tumores abultados al realizar el examen de la pelvis. Una ecografa puede indicarse para tener informacin del tamao, la ubicacin y el nmero de tumores.  TRATAMIENTO   El mdico puede considerar que es conveniente esperar y Barrister's clerk. Esto incluye el control del fibroma por parte del mdico para observar si crece o disminuye su tamao.  Podr indicarle un tratamiento hormonal o el uso de un dispositivo intrauterino (DIU).  En algunos casos es necesaria la ciruga para extirpar el fibroma (miomectoma) o el tero (histerectoma). Esto depender de su situacin. Cuando una mujer desea quedar embarazada y los fibromas interfieren en su fertilidad, el mdico puede recomendar la extirpacin del fibroma.  INSTRUCCIONES PARA EL CUIDADO EN EL HOGAR  Los cuidados en el hogar dependen del tratamiento que haya  recibido. En general:   Cumpla con todas las visitas de control, segn le indique su mdico.  Tome slo medicamentos de venta libre o recetados, segn las indicaciones del mdico. Si le recetaron un tratamiento hormonal, tome los medicamentos hormonales como le indicaron. No tome aspirina. Puede ocasionar hemorragias.  Consulte al mdico si debe tomar pldoras de hierro.  Si sus perodos son molestos pero no tan abundantes, acustese con los pies ligeramente elevados por encima del nivel del corazn. Coloque compresas fras en la zona inferior del abdomen.  Si sus perodos son muy abundantes, anote el nmero de compresas o tampones que [place] cada mes. Lleve esta informacin a su consulta mdica.  Incluya vegetales verdes en su dieta. SOLICITE ATENCIN MDICA DE INMEDIATO SI:  Siente dolor o clicos en la pelvis y no puede controlarlos con los medicamentos.  El dolor en la pelvis aumenta de manera repentina.  Aumenta el sangrado entre los perodos o [employer].  Si tiene perodos muy abundantes y debe cambiar un tampn o una toalla higinica cada media hora o menos.  Se siente mareado o tiene episodios de [place]. Document Released: 05/12/2005 Document Revised: 03/02/2013 [facility] Patient Information 2014 [place], [place].

## 2013-10-28 NOTE — Progress Notes (Signed)
CC: Gynecologic Exam     HPI Jamie [name] is a 48 y.o. [id number]  who presents in F/U from MAU visit for AUB. Had IUD placed 1 yr ago for AUB and was doing well until heavy bleeding started 1 month ago and bleeding q day since. On Megace taper since 10/25/13 and bleeding much decreased; 2 pads/day. Unaware when IUD expelled (not seen on [place]). Unaware she has fibroids. HGB 11.7 on 10/25/13 and denies any orthostatic sx. EB neg 07/2012. Does want another IUD.  Comorbidities hypothyroidism, HTN, obesity   Past Medical History  Diagnosis Date  . Thyroid disease     hypothyroid  . Rh negative status during pregnancy 06/26/2010  . Herpes simplex without mention of complication   . Hypercholesteremia   . HTN (hypertension)   . GERD (gastroesophageal reflux disease)     OB History  Gravida Para Term Preterm AB SAB TAB Ectopic Multiple Living  8 6 6  0 2 2 0 0 0 6    # Outcome Date GA Lbr Len/2nd Weight Sex Delivery Anes PTL Lv  8 TRM 09/12/10 [redacted]w[redacted]d   F LTCS     7 TRM 02/23/02 [redacted]w[redacted]d 03:00 8 lb (3.629 kg) F SVD None N Y     Comments: Actual weight is 8lbs 14 oz.  Term, but exact GA unknown  6 TRM 02/04/98 [redacted]w[redacted]d 04:00 7 lb (3.175 kg) F SVD None N Y     Comments: Unsure of exact GA, but term  5 TRM 02/06/92 [redacted]w[redacted]d 03:00 7 lb (3.175 kg) M SVD None N Y     Comments: term, unsure of exact GA  4 TRM 08/27/89 [redacted]w[redacted]d 03:00 7 lb (3.175 kg) M SVD None N Y     Comments: Unsure exact GA, but term  3 TRM 02/04/84 [redacted]w[redacted]d 36:00  F SVD None N Y     Comments: Can't remember birth weight.  Can't remember exact gestational age, but term  2 SAB           1 SAB              Comments: System Generated. Please review and update pregnancy details.      Past Surgical History  Procedure Laterality Date  . Cesarean section    . Tubal ligation      History   Social History  . Marital Status: Married    Spouse Name: N/A    Number of Children: N/A  . Years of Education: N/A   Occupational History  .  Not on file.   Social History Main Topics  . Smoking status: Never Smoker   . Smokeless tobacco: Never Used  . Alcohol Use: No  . Drug Use: No  . Sexual Activity: Yes    Birth Control/ Protection: Surgical   Other Topics Concern  . Not on file   Social History Narrative  . No narrative on file    Current Outpatient Prescriptions on File Prior to Visit  Medication Sig Dispense Refill  . levothyroxine (SYNTHROID, LEVOTHROID) 75 MCG tablet Take 1 tablet (75 mcg total) by mouth daily.  90 tablet  3  . lisinopril-hydrochlorothiazide (PRINZIDE,ZESTORETIC) 10-12.5 MG per tablet Take 1 tablet by mouth daily.      . megestrol (MEGACE) 20 MG tablet Take two tablets (40 mg) three times per day time three days,  then take two tablets (40 mg) two times per day time three days,  then take two tablets (40 mg)once per day  60 tablet  0  . ranitidine (ZANTAC) 300 MG tablet Take 1 tablet (300 mg total) by mouth as needed for heartburn.  60 tablet  0   No current facility-administered medications on file prior to visit.    No Known Allergies  ROS Pertinent items in HPI  PHYSICAL EXAM Filed Vitals:   10/28/13 0907  BP: 117/75  Pulse: 74  Temp: 97.9 F (36.6 C)   General: Obese Hispanic female in no acute distress Cardiovascular: Normal rate Respiratory: Normal effort Abdomen: Soft, nontender, Superficial indurated firm area 3 cm LLQ  Back: No CVAT Extremities: No edema Neurologic: Alert and oriented Speculum exam: NEFG; vagina with small amount dark blood; cervix clean Bimanual exam: cervix closed, no CMT; uterus 10 wk size; no adnexal tenderness or masses    IMAGING US Transvaginal Non-ob  10/25/2013   CLINICAL DATA:  Pelvic pain, IUD placement  EXAM: TRANSABDOMINAL AND TRANSVAGINAL ULTRASOUND OF PELVIS  TECHNIQUE: Both transabdominal and transvaginal ultrasound examinations of the pelvis were performed. Transabdominal technique was performed for global imaging of the pelvis  including uterus, ovaries, adnexal regions, and pelvic cul-de-sac. It was necessary to proceed with endovaginal exam following the transabdominal exam to visualize the endometrium.  COMPARISON:  Prior pelvic ultrasound 08/07/2012  FINDINGS: Uterus  Measurements: 10.4 x 5.5 x 7.8 cm. Diffusely heterogeneous appearance of the myometrium. There is a dominant heterogeneous hypoechoic fibroid exophytic (subserosal) from the uterine fundus which measures 4.3 x 4.9 x 3.8 cm. There is a smaller 1.6 x 1.1 x 1.4 cm intramural fibroid in the upper uterus.  Endometrium  Thickness: 8.2 mm. Heterogeneous endometrium with fluid in the canal. No IUD is identified.  Right ovary  Unable to visualize secondary to obscuring overlying bowel gas.  Left ovary  Measurements: 3.1 x 1.5 x 1.8 cm. Normal appearance/no adnexal mass.  Other findings  No free fluid.  IMPRESSION: 1. IUD not identified within the endometrial canal. 2. Heterogeneous endometrium with fluid in the canal consistent with current bleeding/menses. 3. Fibroid uterus. The largest fibroid measures up to 4.9 cm and is exophytic (sub serosal) from the right aspect of the uterine fundus. 4. Unable to visualize the right ovary secondary to obscuring bowel gas.   Electronically Signed   By: Jacqulynn Cadet M.D.   On: 10/25/2013 17:00   US Pelvis Complete  10/25/2013   CLINICAL DATA:  Pelvic pain, IUD placement  EXAM: TRANSABDOMINAL AND TRANSVAGINAL ULTRASOUND OF PELVIS  TECHNIQUE: Both transabdominal and transvaginal ultrasound examinations of the pelvis were performed. Transabdominal technique was performed for global imaging of the pelvis including uterus, ovaries, adnexal regions, and pelvic cul-de-sac. It was necessary to proceed with endovaginal exam following the transabdominal exam to visualize the endometrium.  COMPARISON:  Prior pelvic ultrasound 08/07/2012  FINDINGS: Uterus  Measurements: 10.4 x 5.5 x 7.8 cm. Diffusely heterogeneous appearance of the myometrium.  There is a dominant heterogeneous hypoechoic fibroid exophytic (subserosal) from the uterine fundus which measures 4.3 x 4.9 x 3.8 cm. There is a smaller 1.6 x 1.1 x 1.4 cm intramural fibroid in the upper uterus.  Endometrium  Thickness: 8.2 mm. Heterogeneous endometrium with fluid in the canal. No IUD is identified.  Right ovary  Unable to visualize secondary to obscuring overlying bowel gas.  Left ovary  Measurements: 3.1 x 1.5 x 1.8 cm. Normal appearance/no adnexal mass.  Other findings  No free fluid.  IMPRESSION: 1. IUD not identified within the endometrial canal. 2. Heterogeneous endometrium with fluid in the canal consistent with current bleeding/menses.  3. Fibroid uterus. The largest fibroid measures up to 4.9 cm and is exophytic (sub serosal) from the right aspect of the uterine fundus. 4. Unable to visualize the right ovary secondary to obscuring bowel gas.   Electronically Signed   By: Jacqulynn Cadet M.D.   On: 10/25/2013 17:00      ASSESSMENT  1. Abnormal uterine bleeding   IUD expelled Obesity Fibroids Hypothyroidism Language barrier  PLAN Form to get IUD completed> return in 1 month for insertion. Keep bleeding diary.  Pap done. Continue Megace until bleeding stops .    Medication List       This list is accurate as of: 10/28/13  9:40 AM.  Always use your most recent med list.               levothyroxine 75 MCG tablet  Commonly known as:  SYNTHROID, LEVOTHROID  Take 1 tablet (75 mcg total) by mouth daily.     lisinopril-hydrochlorothiazide 10-12.5 MG per tablet  Commonly known as:  PRINZIDE,ZESTORETIC  Take 1 tablet by mouth daily.     megestrol 20 MG tablet  Commonly known as:  MEGACE  - Take two tablets (40 mg) three times per day time three days,   - then take two tablets (40 mg) two times per day time three days,   - then take two tablets (40 mg)once per day     ranitidine 300 MG tablet  Commonly known as:  ZANTAC  Take 1 tablet (300 mg total) by  mouth as needed for heartburn.       AVS and explained fibroids   Lorene Dy, CNM 10/28/2013 9:40 AM

## 2013-10-31 LAB — CYTOLOGY - PAP

## 2013-12-01 ENCOUNTER — Ambulatory Visit: Payer: No Typology Code available for payment source

## 2013-12-19 ENCOUNTER — Encounter: Payer: Self-pay | Admitting: Obstetrics & Gynecology

## 2013-12-19 ENCOUNTER — Ambulatory Visit (INDEPENDENT_AMBULATORY_CARE_PROVIDER_SITE_OTHER): Payer: No Typology Code available for payment source | Admitting: Obstetrics & Gynecology

## 2013-12-19 VITALS — BP 122/65 | HR 69 | Temp 98.2°F | Ht 61.0 in | Wt 183.1 lb

## 2013-12-19 DIAGNOSIS — D252 Subserosal leiomyoma of uterus: Secondary | ICD-10-CM

## 2013-12-19 DIAGNOSIS — N926 Irregular menstruation, unspecified: Secondary | ICD-10-CM

## 2013-12-19 DIAGNOSIS — Z30430 Encounter for insertion of intrauterine contraceptive device: Secondary | ICD-10-CM

## 2013-12-19 DIAGNOSIS — N939 Abnormal uterine and vaginal bleeding, unspecified: Secondary | ICD-10-CM

## 2013-12-19 MED ORDER — LEVONORGESTREL 20 MCG/24HR IU IUD
INTRAUTERINE_SYSTEM | Freq: Once | INTRAUTERINE | Status: AC
  Administered 2013-12-19: 1 via INTRAUTERINE

## 2013-12-19 NOTE — Progress Notes (Signed)
    [place] PROCEDURE NOTE  Airis Barbee is a 48 y.o. [id number] here for Mirena IUD insertion for AUB. Patient is Spanish-speaking only, Spanish interpreter present for this encounter. Last Mirena was placed in 08/2012 but was expelled at some point.   No GYN concerns.  Last pap smear was on 10/28/13 and was normal with negative HRHPV. Normal mammogram in 2014.  IUD Insertion Procedure Note Patient identified, informed consent performed.  Discussed risks of irregular bleeding, cramping, infection, malpositioning or misplacement of the IUD outside the uterus which may require further procedure such as laparoscopy. Time out was performed.  Urine pregnancy test negative.  Speculum placed in the vagina.  Cervix visualized.  Cleaned with Betadine x 2.  Grasped anteriorly with a single tooth tenaculum.  Uterus sounded to 12 cm.  Mirena IUD placed per manufacturer's recommendations.  Strings trimmed to 3 cm. Tenaculum was removed, good hemostasis noted.  Patient tolerated procedure well.   Patient was given post-procedure instructions.  She was advised to be have backup contraception for one week.  Patient was also asked to check IUD strings periodically and follow up in 4 weeks for IUD check.    Verita Schneiders, MD, [place] Attending [place] for [employer], [place]

## 2013-12-20 ENCOUNTER — Encounter: Payer: Self-pay | Admitting: *Deleted

## 2013-12-20 NOTE — Progress Notes (Signed)
Mirena was a free Mirena provided by [employer].  No charge to the patient for the Mirena device.

## 2013-12-26 ENCOUNTER — Telehealth: Payer: Self-pay | Admitting: Internal Medicine

## 2013-12-26 NOTE — Telephone Encounter (Signed)
Pt needs refill for Lisinopil 20 mg at [facility]. Plesase f/u with Pt

## 2014-01-05 ENCOUNTER — Ambulatory Visit: Payer: No Typology Code available for payment source | Attending: Internal Medicine | Admitting: Internal Medicine

## 2014-01-05 ENCOUNTER — Encounter: Payer: Self-pay | Admitting: Internal Medicine

## 2014-01-05 VITALS — BP 147/79 | HR 73 | Temp 97.9°F | Resp 18 | Wt 185.0 lb

## 2014-01-05 DIAGNOSIS — K219 Gastro-esophageal reflux disease without esophagitis: Secondary | ICD-10-CM | POA: Insufficient documentation

## 2014-01-05 DIAGNOSIS — E039 Hypothyroidism, unspecified: Secondary | ICD-10-CM | POA: Insufficient documentation

## 2014-01-05 DIAGNOSIS — Z139 Encounter for screening, unspecified: Secondary | ICD-10-CM

## 2014-01-05 DIAGNOSIS — I10 Essential (primary) hypertension: Secondary | ICD-10-CM | POA: Insufficient documentation

## 2014-01-05 LAB — CBC WITH DIFFERENTIAL/PLATELET
BASOS ABS: 0.1 10*3/uL (ref 0.0–0.1)
BASOS PCT: 2 % — AB (ref 0–1)
Eosinophils Absolute: 0.1 10*3/uL (ref 0.0–0.7)
Eosinophils Relative: 2 % (ref 0–5)
HEMATOCRIT: 32.2 % — AB (ref 36.0–46.0)
Hemoglobin: 10.8 g/dL — ABNORMAL LOW (ref 12.0–15.0)
LYMPHS PCT: 29 % (ref 12–46)
Lymphs Abs: 1.8 10*3/uL (ref 0.7–4.0)
MCH: 31.3 pg (ref 26.0–34.0)
MCHC: 33.5 g/dL (ref 30.0–36.0)
MCV: 93.3 fL (ref 78.0–100.0)
MONO ABS: 0.6 10*3/uL (ref 0.1–1.0)
Monocytes Relative: 9 % (ref 3–12)
NEUTROS ABS: 3.7 10*3/uL (ref 1.7–7.7)
Neutrophils Relative %: 58 % (ref 43–77)
PLATELETS: 283 10*3/uL (ref 150–400)
RBC: 3.45 MIL/uL — ABNORMAL LOW (ref 3.87–5.11)
RDW: 12.7 % (ref 11.5–15.5)
WBC: 6.3 10*3/uL (ref 4.0–10.5)

## 2014-01-05 MED ORDER — LISINOPRIL-HYDROCHLOROTHIAZIDE 10-12.5 MG PO TABS: 1.0000 | ORAL_TABLET | Freq: Every day | ORAL | Status: DC

## 2014-01-05 MED ORDER — RANITIDINE HCL 300 MG PO TABS: 300.0000 mg | ORAL_TABLET | ORAL | Status: DC | PRN

## 2014-01-05 NOTE — Progress Notes (Signed)
Pt is here for a f/u on thyroids and to have general labs done Also needing refills on all meds Alert w/no signs of acute distress.

## 2014-01-05 NOTE — Progress Notes (Signed)
MRN: [id number] Name: Jamie [name]  Sex: female Age: 48 y.o. DOB: [birth year]  Allergies: Review of patient's allergies indicates no known allergies.  Chief Complaint  Patient presents with  . Follow-up    HPI: Patient is 48 y.o. female who has to of hypothyroidism hypertension, GERD  comes today for followup, she is requesting refill on her medications today her blood pressure is borderline elevated, as per patient she has not taking blood pressure medication today, she denies any dizziness chest and shortness of breath.  Past Medical History  Diagnosis Date  . Thyroid disease     hypothyroid  . Blood type, Rh negative 06/26/2010  . Herpes simplex without mention of complication   . Hypercholesteremia   . HTN (hypertension)   . GERD (gastroesophageal reflux disease)     Past Surgical History  Procedure Laterality Date  . Cesarean section    . Tubal ligation        Medication List       This list is accurate as of: 01/05/14  9:29 AM.  Always use your most recent med list.               levothyroxine 75 MCG tablet  Commonly known as:  SYNTHROID, LEVOTHROID  Take 1 tablet (75 mcg total) by mouth daily.     lisinopril-hydrochlorothiazide 10-12.5 MG per tablet  Commonly known as:  PRINZIDE,ZESTORETIC  Take 1 tablet by mouth daily.     ranitidine 300 MG tablet  Commonly known as:  ZANTAC  Take 1 tablet (300 mg total) by mouth as needed for heartburn.        Meds ordered this encounter  Medications  . lisinopril-hydrochlorothiazide (PRINZIDE,ZESTORETIC) 10-12.5 MG per tablet    Sig: Take 1 tablet by mouth daily.    Dispense:  30 tablet    Refill:  3  . ranitidine (ZANTAC) 300 MG tablet    Sig: Take 1 tablet (300 mg total) by mouth as needed for heartburn.    Dispense:  60 tablet    Refill:  0    Immunization History  Administered Date(s) Administered  . Influenza Whole 05/02/2010    Family History  Problem Relation Age of Onset  . Heart  disease Father   . Diabetes Sister   . Diabetes Sister     History  Substance Use Topics  . Smoking status: Never Smoker   . Smokeless tobacco: Never Used  . Alcohol Use: No    Review of Systems   As noted in HPI  Filed Vitals:   01/05/14 0910  BP: 147/79  Pulse: 73  Temp: 97.9 F (36.6 C)  Resp: 18    Physical Exam  Physical Exam  Constitutional: No distress.  Eyes: EOM are normal. Pupils are equal, round, and reactive to light.  Cardiovascular: Normal rate and regular rhythm.   Pulmonary/Chest: Breath sounds normal. No respiratory distress. She has no wheezes. She has no rales.  Musculoskeletal: She exhibits no edema.    CBC    Component Value Date/Time   WBC 9.7 10/25/2013 1339   RBC 3.63* 10/25/2013 1339   RBC 3.41* 08/06/2012 2149   HGB 11.9* 10/25/2013 1339   HCT 33.3* 10/25/2013 1339   PLT 217 10/25/2013 1339   MCV 91.7 10/25/2013 1339   LYMPHSABS 1.4 04/11/2013 1555   MONOABS 0.6 04/11/2013 1555   EOSABS 0.1 04/11/2013 1555   BASOSABS 0.1 04/11/2013 1555    CMP     Component Value Date/Time  NA 139 04/11/2013 1555   K 3.8 04/11/2013 1555   CL 106 04/11/2013 1555   CO2 27 04/11/2013 1555   GLUCOSE 136* 04/11/2013 1555   BUN 16 04/11/2013 1555   CREATININE 0.73 04/11/2013 1555   CREATININE 0.86 08/07/2012 0430   CALCIUM 9.2 04/11/2013 1555   PROT 6.7 04/11/2013 1555   ALBUMIN 3.9 04/11/2013 1555   AST 17 04/11/2013 1555   ALT 11 04/11/2013 1555   ALKPHOS 57 04/11/2013 1555   BILITOT 1.2 04/11/2013 1555   GFRNONAA 79* 08/07/2012 0430   GFRAA >90 08/07/2012 0430    Lab Results  Component Value Date/Time   CHOL 238* 04/11/2013  3:55 PM    No components found with this basename: hga1c    Lab Results  Component Value Date/Time   AST 17 04/11/2013  3:55 PM    Assessment and Plan  Unspecified hypothyroidism - Plan: Will repeat her TSH level, currently she is on levothyroxine 75 mcg daily  Essential hypertension - Plan: Will check blood  chemistry COMPLETE METABOLIC PANEL WITH GFR, continue with her lisinopril-hydrochlorothiazide (PRINZIDE,ZESTORETIC) 10-12.5 MG per tablet  Screening - Plan: We'll do baseline blood work CBC with Differential, COMPLETE METABOLIC PANEL WITH GFR, Lipid panel, Vit D  25 hydroxy (rtn osteoporosis monitoring), MM DIGITAL SCREENING BILATERAL  Gastroesophageal reflux disease, esophagitis presence not specified - Plan: Lifestyle modification ranitidine (ZANTAC) 300 MG tablet   Health Maintenance  -Mammogram: ordered   Return in about 3 months (around 04/07/2014) for hypertension, hypothyroid.  Lorayne Marek, MD

## 2014-01-05 NOTE — Patient Instructions (Signed)

## 2014-01-06 LAB — COMPLETE METABOLIC PANEL WITH GFR
ALK PHOS: 64 U/L (ref 39–117)
ALT: 9 U/L (ref 0–35)
AST: 13 U/L (ref 0–37)
Albumin: 4 g/dL (ref 3.5–5.2)
BUN: 24 mg/dL — AB (ref 6–23)
CO2: 22 mEq/L (ref 19–32)
CREATININE: 0.79 mg/dL (ref 0.50–1.10)
Calcium: 8.9 mg/dL (ref 8.4–10.5)
Chloride: 105 mEq/L (ref 96–112)
GFR, Est African American: >89 mL/min
GFR, Est Non African American: 89 mL/min
Glucose, Bld: 105 mg/dL — ABNORMAL HIGH (ref 70–99)
Potassium: 4 mEq/L (ref 3.5–5.3)
Sodium: 137 mEq/L (ref 135–145)
Total Bilirubin: 0.5 mg/dL (ref 0.2–1.2)
Total Protein: 6.3 g/dL (ref 6.0–8.3)

## 2014-01-06 LAB — VITAMIN D 25 HYDROXY (VIT D DEFICIENCY, FRACTURES): Vit D, 25-Hydroxy: 29 ng/mL — ABNORMAL LOW (ref 30–89)

## 2014-01-06 LAB — LIPID PANEL
CHOL/HDL RATIO: 4.6 ratio
CHOLESTEROL: 219 mg/dL — AB (ref 0–200)
HDL: 48 mg/dL (ref >39)
LDL Cholesterol: 144 mg/dL — ABNORMAL HIGH (ref 0–99)
Triglycerides: 137 mg/dL (ref <150)
VLDL: 27 mg/dL (ref 0–40)

## 2014-01-06 LAB — TSH: TSH: 7.55 u[IU]/mL — ABNORMAL HIGH (ref 0.350–4.500)

## 2014-01-09 ENCOUNTER — Telehealth: Payer: Self-pay | Admitting: *Deleted

## 2014-01-09 DIAGNOSIS — E039 Hypothyroidism, unspecified: Secondary | ICD-10-CM

## 2014-01-09 MED ORDER — LEVOTHYROXINE SODIUM 88 MCG PO TABS: 75.0000 ug | ORAL_TABLET | Freq: Every day | ORAL | Status: DC

## 2014-01-09 NOTE — Telephone Encounter (Signed)
Pt is aware of her lab results and she is aware that she needs some OTC medication and will pick up her new RX at her pharmacy.

## 2014-01-09 NOTE — Telephone Encounter (Signed)
Message copied by Joan Mayans on Mon Jan 09, 2014 12:34 PM ------      Message from: Lorayne Marek      Created: Fri Jan 06, 2014 12:40 PM       Blood work reviewed noticed impaired fasting glucose and elevated cholesterol, call and advise patient for low carbohydrate and low fat diet.      Her TSH level is elevated/abnormal, currently patient is taking levothyroxine 75 mcg daily, advised patient to take levothyroxine 88 mcg daily we'll repeat TSH level on the next visit.      Also her vitamin D is borderline low, advise patient to take over-the-counter 2000 units daily.      Patient also has history of anemia advised patient to take over-the-counter iron supplements 3 times daily. ------

## 2014-01-10 ENCOUNTER — Telehealth: Payer: Self-pay | Admitting: Internal Medicine

## 2014-01-18 ENCOUNTER — Encounter: Payer: Self-pay | Admitting: General Practice

## 2014-01-19 ENCOUNTER — Ambulatory Visit (INDEPENDENT_AMBULATORY_CARE_PROVIDER_SITE_OTHER): Payer: Self-pay | Admitting: Obstetrics & Gynecology

## 2014-01-19 VITALS — BP 119/72 | HR 81 | Temp 98.0°F | Wt 186.3 lb

## 2014-01-19 DIAGNOSIS — N939 Abnormal uterine and vaginal bleeding, unspecified: Secondary | ICD-10-CM

## 2014-01-19 DIAGNOSIS — N926 Irregular menstruation, unspecified: Secondary | ICD-10-CM

## 2014-01-19 DIAGNOSIS — Z30431 Encounter for routine checking of intrauterine contraceptive device: Secondary | ICD-10-CM

## 2014-01-19 NOTE — Progress Notes (Signed)
   CLINIC ENCOUNTER NOTE  History:  48 y.o. [id number] here today for IUD check. Mirena placed 12/19/13 for AUB.  Patient is Spanish-speaking only, Spanish interpreter present for this encounter.  No complaints.  The following portions of the patient's history were reviewed and updated as appropriate: allergies, current medications, past family history, past medical history, past social history, past surgical history and problem list.  Review of Systems:  A comprehensive review of systems was negative.  Objective:  Physical Exam BP 119/72  Pulse 81  Temp(Src) 98 F (36.7 C)  Wt 186 lb 4.8 oz (84.505 kg)  LMP 01/05/2014 Gen: NAD Abd: Soft, nontender and nondistended Pelvic: Normal appearing external genitalia; normal appearing vaginal mucosa and cervix.  IUD strings visualized about 3 cm in length from external os. Scant brown discharge.  Small uterus, no other palpable masses, no uterine or adnexal tenderness  Assessment & Plan:  Normal IUD check Patient told to RTC for any concerns.    Jamie Schneiders, MD, [place] Attending [place] for [employer], [place]

## 2014-01-19 NOTE — Progress Notes (Signed)
Interpreter Verdis Frederickson used for interpreter.

## 2014-01-19 NOTE — Patient Instructions (Signed)
Regrese a la clinica cuando tenga su cita. Si tiene problemas o preguntas, llama a la clinica o vaya a la sala de emergencia al [facility].    

## 2014-03-27 ENCOUNTER — Encounter: Payer: Self-pay | Admitting: Internal Medicine

## 2014-05-02 ENCOUNTER — Encounter: Payer: Self-pay | Admitting: Internal Medicine

## 2014-05-02 ENCOUNTER — Ambulatory Visit: Payer: No Typology Code available for payment source | Attending: Internal Medicine | Admitting: Internal Medicine

## 2014-05-02 VITALS — BP 120/60 | HR 75 | Temp 98.0°F | Resp 16 | Wt 189.8 lb

## 2014-05-02 DIAGNOSIS — Z0000 Encounter for general adult medical examination without abnormal findings: Secondary | ICD-10-CM

## 2014-05-02 DIAGNOSIS — H538 Other visual disturbances: Secondary | ICD-10-CM | POA: Insufficient documentation

## 2014-05-02 DIAGNOSIS — I10 Essential (primary) hypertension: Secondary | ICD-10-CM | POA: Insufficient documentation

## 2014-05-02 DIAGNOSIS — E079 Disorder of thyroid, unspecified: Secondary | ICD-10-CM

## 2014-05-02 DIAGNOSIS — D509 Iron deficiency anemia, unspecified: Secondary | ICD-10-CM | POA: Insufficient documentation

## 2014-05-02 DIAGNOSIS — K029 Dental caries, unspecified: Secondary | ICD-10-CM | POA: Insufficient documentation

## 2014-05-02 DIAGNOSIS — Z2821 Immunization not carried out because of patient refusal: Secondary | ICD-10-CM | POA: Insufficient documentation

## 2014-05-02 DIAGNOSIS — E039 Hypothyroidism, unspecified: Secondary | ICD-10-CM | POA: Insufficient documentation

## 2014-05-02 DIAGNOSIS — B354 Tinea corporis: Secondary | ICD-10-CM | POA: Insufficient documentation

## 2014-05-02 LAB — CBC WITH DIFFERENTIAL/PLATELET
BASOS ABS: 0.1 10*3/uL (ref 0.0–0.1)
Basophils Relative: 1 % (ref 0–1)
EOS ABS: 0.2 10*3/uL (ref 0.0–0.7)
Eosinophils Relative: 2 % (ref 0–5)
HEMATOCRIT: 36.9 % (ref 36.0–46.0)
Hemoglobin: 12.9 g/dL (ref 12.0–15.0)
LYMPHS PCT: 25 % (ref 12–46)
Lymphs Abs: 1.9 10*3/uL (ref 0.7–4.0)
MCH: 31.8 pg (ref 26.0–34.0)
MCHC: 35 g/dL (ref 30.0–36.0)
MCV: 90.9 fL (ref 78.0–100.0)
MONO ABS: 0.5 10*3/uL (ref 0.1–1.0)
MPV: 9.9 fL (ref 9.4–12.4)
Monocytes Relative: 7 % (ref 3–12)
Neutro Abs: 4.9 10*3/uL (ref 1.7–7.7)
Neutrophils Relative %: 65 % (ref 43–77)
PLATELETS: 245 10*3/uL (ref 150–400)
RBC: 4.06 MIL/uL (ref 3.87–5.11)
RDW: 13 % (ref 11.5–15.5)
WBC: 7.5 10*3/uL (ref 4.0–10.5)

## 2014-05-02 LAB — POCT GLYCOSYLATED HEMOGLOBIN (HGB A1C): Hemoglobin A1C: 5.6

## 2014-05-02 LAB — COMPLETE METABOLIC PANEL WITH GFR
ALBUMIN: 4.1 g/dL (ref 3.5–5.2)
ALT: 11 U/L (ref 0–35)
AST: 15 U/L (ref 0–37)
Alkaline Phosphatase: 58 U/L (ref 39–117)
BILIRUBIN TOTAL: 1.1 mg/dL (ref 0.2–1.2)
BUN: 16 mg/dL (ref 6–23)
CO2: 25 mEq/L (ref 19–32)
Calcium: 9.2 mg/dL (ref 8.4–10.5)
Chloride: 102 mEq/L (ref 96–112)
Creat: 0.74 mg/dL (ref 0.50–1.10)
GFR, Est Non African American: >89 mL/min
Glucose, Bld: 141 mg/dL — ABNORMAL HIGH (ref 70–99)
Potassium: 3.8 mEq/L (ref 3.5–5.3)
Sodium: 136 mEq/L (ref 135–145)
Total Protein: 6.8 g/dL (ref 6.0–8.3)

## 2014-05-02 LAB — GLUCOSE, POCT (MANUAL RESULT ENTRY): POC GLUCOSE: 141 mg/dL — AB (ref 70–99)

## 2014-05-02 LAB — TSH: TSH: 3.139 u[IU]/mL (ref 0.350–4.500)

## 2014-05-02 MED ORDER — CLOTRIMAZOLE-BETAMETHASONE 1-0.05 % EX CREA: 1.0000 "application " | TOPICAL_CREAM | Freq: Two times a day (BID) | CUTANEOUS | Status: DC

## 2014-05-02 NOTE — Progress Notes (Signed)
Patient here with interpreter Here for follow up on her thyroid and anemia Would like her sugar checked as well Has been checking it at home with her husbands machine And has been running about 120 fasting in the am

## 2014-05-02 NOTE — Progress Notes (Signed)
MRN: [id number] Name: Jamie [name]  Sex: female Age: 48 y.o. DOB: 1966-03-22  Allergies: Review of patient's allergies indicates no known allergies.  Chief Complaint  Patient presents with  . Follow-up    HPI: Patient is 48 y.o. female who History of hypertension hypothyroidism GERD anemia comes today for followup, on the last visit her levothyroxine dose was increased, initially her blood pressure was elevated but repeat manual blood pressure is 120/60, she also history of tinea corporis and is requesting prescription for the cream, patient is also advised to take iron supplements regularly.patient also complaining of blurry vision and has lot of dental cavities and is requesting referral to see a dentist.patient also has family history of diabetes and wants to be checked.  Past Medical History  Diagnosis Date  . Thyroid disease     hypothyroid  . Blood type, Rh negative 06/26/2010  . Herpes simplex without mention of complication   . Hypercholesteremia   . HTN (hypertension)   . GERD (gastroesophageal reflux disease)     Past Surgical History  Procedure Laterality Date  . Cesarean section    . Tubal ligation        Medication List       This list is accurate as of: 05/02/14 12:25 PM.  Always use your most recent med list.               b complex vitamins capsule  Take 1 capsule by mouth daily.     clotrimazole-betamethasone cream  Commonly known as:  LOTRISONE  Apply 1 application topically 2 (two) times daily.     ferrous sulfate 325 (65 FE) MG tablet  Take 325 mg by mouth daily with breakfast.     levothyroxine 88 MCG tablet  Commonly known as:  SYNTHROID, LEVOTHROID  Take 1 tablet (88 mcg total) by mouth daily.     lisinopril-hydrochlorothiazide 10-12.5 MG per tablet  Commonly known as:  PRINZIDE,ZESTORETIC  Take 1 tablet by mouth daily.     ranitidine 300 MG tablet  Commonly known as:  ZANTAC  Take 1 tablet (300 mg total) by mouth as  needed for heartburn.        Meds ordered this encounter  Medications  . clotrimazole-betamethasone (LOTRISONE) cream    Sig: Apply 1 application topically 2 (two) times daily.    Dispense:  30 g    Refill:  1    Immunization History  Administered Date(s) Administered  . Influenza Whole 05/02/2010    Family History  Problem Relation Age of Onset  . Heart disease Father   . Diabetes Sister   . Diabetes Sister     History  Substance Use Topics  . Smoking status: Never Smoker   . Smokeless tobacco: Never Used  . Alcohol Use: No    Review of Systems   As noted in HPI  Filed Vitals:   05/02/14 1214  BP: 120/60  Pulse:   Temp:   Resp:     Physical Exam  Physical Exam  Constitutional: No distress.  HENT:  Dental cavities   Eyes: EOM are normal. Pupils are equal, round, and reactive to light.  Cardiovascular: Normal rate and regular rhythm.   Pulmonary/Chest: Breath sounds normal. No respiratory distress. She has no wheezes. She has no rales.  Musculoskeletal: She exhibits no edema.  Skin:  Round Rash on right side of abdomen     CBC    Component Value Date/Time   WBC 6.3 01/05/2014 0933  RBC 3.45* 01/05/2014 0933   RBC 3.41* 08/06/2012 2149   HGB 10.8* 01/05/2014 0933   HCT 32.2* 01/05/2014 0933   PLT 283 01/05/2014 0933   MCV 93.3 01/05/2014 0933   LYMPHSABS 1.8 01/05/2014 0933   MONOABS 0.6 01/05/2014 0933   EOSABS 0.1 01/05/2014 0933   BASOSABS 0.1 01/05/2014 0933    CMP     Component Value Date/Time   NA 137 01/05/2014 0933   K 4.0 01/05/2014 0933   CL 105 01/05/2014 0933   CO2 22 01/05/2014 0933   GLUCOSE 105* 01/05/2014 0933   BUN 24* 01/05/2014 0933   CREATININE 0.79 01/05/2014 0933   CREATININE 0.86 08/07/2012 0430   CALCIUM 8.9 01/05/2014 0933   PROT 6.3 01/05/2014 0933   ALBUMIN 4.0 01/05/2014 0933   AST 13 01/05/2014 0933   ALT 9 01/05/2014 0933   ALKPHOS 64 01/05/2014 0933   BILITOT 0.5 01/05/2014 0933   GFRNONAA 89  01/05/2014 0933   GFRNONAA 79* 08/07/2012 0430   GFRAA >89 01/05/2014 0933   GFRAA >90 08/07/2012 0430    Lab Results  Component Value Date/Time   CHOL 219* 01/05/2014 09:33 AM    No components found for: HGA1C  Lab Results  Component Value Date/Time   AST 13 01/05/2014 09:33 AM    Assessment and Plan  Preventative health care - Plan:  Results for orders placed or performed in visit on 05/02/14  Glucose (CBG)  Result Value Ref Range   POC Glucose 141 (A) 70 - 99 mg/dl  HgB A1c  Result Value Ref Range   Hemoglobin A1C 5.6    Since patient has family history of diabetes she's advised for low carbohydrate diet.  Essential hypertension - Plan: blood pressure is well controlled, continue with current medications, we'll check COMPLETE METABOLIC PANEL WITH GFR  Hypothyroidism, unspecified hypothyroidism type - Plan: currently patient is on levothyroxine 88 mcg daily, repeat herTSHlevel  Anemia, iron deficiency - Plan:advised patient to take iron supplement 3 times a day, repeat her CBC with Differential  Tinea corporis - Plan: clotrimazole-betamethasone (LOTRISONE) cream  Blurry vision - Plan: Ambulatory referral to Ophthalmology  Dental cavities - Plan: Ambulatory referral to Dentistry   Health Maintenance : -Vaccinations:  Patient declines flu shot   Return in about 3 months (around 08/01/2014) for hypertension.  Lorayne Marek, MD

## 2014-05-03 ENCOUNTER — Telehealth: Payer: Self-pay | Admitting: *Deleted

## 2014-05-03 DIAGNOSIS — I10 Essential (primary) hypertension: Secondary | ICD-10-CM

## 2014-05-03 MED ORDER — LISINOPRIL-HYDROCHLOROTHIAZIDE 10-12.5 MG PO TABS
1.0000 | ORAL_TABLET | Freq: Every day | ORAL | Status: DC
Start: 2014-05-03 — End: 2014-05-03

## 2014-05-03 MED ORDER — LISINOPRIL-HYDROCHLOROTHIAZIDE 10-12.5 MG PO TABS: 1.0000 | ORAL_TABLET | Freq: Every day | ORAL | Status: DC

## 2014-05-03 MED ORDER — LEVOTHYROXINE SODIUM 88 MCG PO TABS: 75.0000 ug | ORAL_TABLET | Freq: Every day | ORAL | Status: DC

## 2014-05-03 NOTE — Telephone Encounter (Signed)
-----   Message from Lorayne Marek, MD sent at 05/03/2014 10:36 AM EST ----- Call and let the patient know that her TSH level now is in normal range, she'll continue with current dose of levothyroxine.

## 2014-05-03 NOTE — Addendum Note (Signed)
Addended by: Dorothe Pea on: 05/03/2014 03:41 PM   Modules accepted: Orders, Medications

## 2014-05-03 NOTE — Telephone Encounter (Signed)
Pt aware of lab results. Stated need medicine refills Rx refills was send to [place]

## 2014-07-31 NOTE — Telephone Encounter (Signed)
Error

## 2014-08-03 ENCOUNTER — Ambulatory Visit: Payer: No Typology Code available for payment source | Admitting: Internal Medicine

## 2014-08-04 ENCOUNTER — Ambulatory Visit: Payer: No Typology Code available for payment source | Admitting: Internal Medicine

## 2014-08-14 ENCOUNTER — Encounter: Payer: Self-pay | Admitting: Internal Medicine

## 2014-08-14 ENCOUNTER — Ambulatory Visit (HOSPITAL_COMMUNITY)
Admission: RE | Admit: 2014-08-14 | Discharge: 2014-08-14 | Disposition: A | Discharge status: Home / Self Care | Payer: No Typology Code available for payment source | Source: Ambulatory Visit | Attending: Internal Medicine | Admitting: Internal Medicine

## 2014-08-14 ENCOUNTER — Ambulatory Visit: Payer: No Typology Code available for payment source | Attending: Internal Medicine | Admitting: Internal Medicine

## 2014-08-14 VITALS — BP 134/80 | HR 94 | Temp 98.0°F | Resp 16 | Wt 188.6 lb

## 2014-08-14 DIAGNOSIS — M79672 Pain in left foot: Secondary | ICD-10-CM | POA: Insufficient documentation

## 2014-08-14 DIAGNOSIS — E079 Disorder of thyroid, unspecified: Secondary | ICD-10-CM

## 2014-08-14 DIAGNOSIS — M19072 Primary osteoarthritis, left ankle and foot: Secondary | ICD-10-CM | POA: Insufficient documentation

## 2014-08-14 DIAGNOSIS — M7989 Other specified soft tissue disorders: Secondary | ICD-10-CM | POA: Insufficient documentation

## 2014-08-14 DIAGNOSIS — E039 Hypothyroidism, unspecified: Secondary | ICD-10-CM | POA: Insufficient documentation

## 2014-08-14 DIAGNOSIS — Z1211 Encounter for screening for malignant neoplasm of colon: Secondary | ICD-10-CM

## 2014-08-14 DIAGNOSIS — K219 Gastro-esophageal reflux disease without esophagitis: Secondary | ICD-10-CM | POA: Insufficient documentation

## 2014-08-14 DIAGNOSIS — B354 Tinea corporis: Secondary | ICD-10-CM | POA: Insufficient documentation

## 2014-08-14 DIAGNOSIS — L299 Pruritus, unspecified: Secondary | ICD-10-CM | POA: Insufficient documentation

## 2014-08-14 DIAGNOSIS — I10 Essential (primary) hypertension: Secondary | ICD-10-CM | POA: Insufficient documentation

## 2014-08-14 LAB — COMPLETE METABOLIC PANEL WITH GFR
ALT: 14 U/L (ref 0–35)
AST: 17 U/L (ref 0–37)
Albumin: 4.1 g/dL (ref 3.5–5.2)
Alkaline Phosphatase: 55 U/L (ref 39–117)
BILIRUBIN TOTAL: 1 mg/dL (ref 0.2–1.2)
BUN: 18 mg/dL (ref 6–23)
CHLORIDE: 102 meq/L (ref 96–112)
CO2: 26 mEq/L (ref 19–32)
Calcium: 8.8 mg/dL (ref 8.4–10.5)
Creat: 0.78 mg/dL (ref 0.50–1.10)
GFR, Est Non African American: >89 mL/min
Glucose, Bld: 104 mg/dL — ABNORMAL HIGH (ref 70–99)
Potassium: 3.8 mEq/L (ref 3.5–5.3)
SODIUM: 136 meq/L (ref 135–145)
Total Protein: 6.5 g/dL (ref 6.0–8.3)

## 2014-08-14 LAB — TSH: TSH: 4.77 u[IU]/mL — ABNORMAL HIGH (ref 0.350–4.500)

## 2014-08-14 MED ORDER — LEVOTHYROXINE SODIUM 88 MCG PO TABS: 88.0000 ug | ORAL_TABLET | Freq: Every day | ORAL | Status: DC

## 2014-08-14 MED ORDER — CLOTRIMAZOLE-BETAMETHASONE 1-0.05 % EX CREA: 1.0000 "application " | TOPICAL_CREAM | Freq: Two times a day (BID) | CUTANEOUS | Status: DC

## 2014-08-14 MED ORDER — IBUPROFEN 600 MG PO TABS: 600.0000 mg | ORAL_TABLET | Freq: Three times a day (TID) | ORAL | Status: DC | PRN

## 2014-08-14 MED ORDER — CETIRIZINE HCL 10 MG PO TABS: 10.0000 mg | ORAL_TABLET | Freq: Every day | ORAL | Status: DC

## 2014-08-14 MED ORDER — LISINOPRIL-HYDROCHLOROTHIAZIDE 10-12.5 MG PO TABS: 1.0000 | ORAL_TABLET | Freq: Every day | ORAL | Status: DC

## 2014-08-14 NOTE — Progress Notes (Signed)
MRN: [id number] Name: Jamie [name]  Sex: female Age: 49 y.o. DOB: [birth year]  Allergies: Review of patient's allergies indicates no known allergies.  Chief Complaint  Patient presents with  . Follow-up    HPI: Patient is 49 y.o. female who has to of hypertension, hypothyroidism, GERD, comes today for followup today she is complaining of left foot pain on and off for several weeks as per patient she had fallen 4 months ago since then she has the symptoms, she has taken ibuprofen which helps with symptoms, she is also requesting refill on her medications. Patient also has a rash on her right upper arm and was prescribed antifungal medication in the past she needs refill.  Past Medical History  Diagnosis Date  . Thyroid disease     hypothyroid  . Blood type, Rh negative 06/26/2010  . Herpes simplex without mention of complication   . Hypercholesteremia   . HTN (hypertension)   . GERD (gastroesophageal reflux disease)     Past Surgical History  Procedure Laterality Date  . Cesarean section    . Tubal ligation        Medication List       This list is accurate as of: 08/14/14 11:28 AM.  Always use your most recent med list.               b complex vitamins capsule  Take 1 capsule by mouth daily.     cetirizine 10 MG tablet  Commonly known as:  ZYRTEC  Take 1 tablet (10 mg total) by mouth daily.     clotrimazole-betamethasone cream  Commonly known as:  LOTRISONE  Apply 1 application topically 2 (two) times daily.     ferrous sulfate 325 (65 FE) MG tablet  Take 325 mg by mouth daily with breakfast.     ibuprofen 600 MG tablet  Commonly known as:  ADVIL,MOTRIN  Take 1 tablet (600 mg total) by mouth every 8 (eight) hours as needed.     levothyroxine 88 MCG tablet  Commonly known as:  SYNTHROID, LEVOTHROID  Take 1 tablet (88 mcg total) by mouth daily.     lisinopril-hydrochlorothiazide 10-12.5 MG per tablet  Commonly known as:  PRINZIDE,ZESTORETIC    Take 1 tablet by mouth daily.     lisinopril-hydrochlorothiazide 10-12.5 MG per tablet  Commonly known as:  PRINZIDE,ZESTORETIC  Take 1 tablet by mouth daily.     ranitidine 300 MG tablet  Commonly known as:  ZANTAC  Take 1 tablet (300 mg total) by mouth as needed for heartburn.        Meds ordered this encounter  Medications  . clotrimazole-betamethasone (LOTRISONE) cream    Sig: Apply 1 application topically 2 (two) times daily.    Dispense:  30 g    Refill:  1  . levothyroxine (SYNTHROID, LEVOTHROID) 88 MCG tablet    Sig: Take 1 tablet (88 mcg total) by mouth daily.    Dispense:  90 tablet    Refill:  3  . lisinopril-hydrochlorothiazide (PRINZIDE,ZESTORETIC) 10-12.5 MG per tablet    Sig: Take 1 tablet by mouth daily.    Dispense:  30 tablet    Refill:  3  . cetirizine (ZYRTEC) 10 MG tablet    Sig: Take 1 tablet (10 mg total) by mouth daily.    Dispense:  30 tablet    Refill:  3  . ibuprofen (ADVIL,MOTRIN) 600 MG tablet    Sig: Take 1 tablet (600 mg total) by mouth every 8 (  eight) hours as needed.    Dispense:  30 tablet    Refill:  1    Immunization History  Administered Date(s) Administered  . Influenza Whole 05/02/2010    Family History  Problem Relation Age of Onset  . Heart disease Father   . Diabetes Sister   . Diabetes Sister     History  Substance Use Topics  . Smoking status: Never Smoker   . Smokeless tobacco: Never Used  . Alcohol Use: No    Review of Systems   As noted in HPI  Filed Vitals:   08/14/14 1107  BP: 134/80  Pulse: 94  Temp: 98 F (36.7 C)  Resp: 16    Physical Exam  Physical Exam  Constitutional: No distress.  Eyes: EOM are normal. Pupils are equal, round, and reactive to light.  Cardiovascular: Normal rate and regular rhythm.   Pulmonary/Chest: Breath sounds normal. No respiratory distress. She has no wheezes. She has no rales.  Musculoskeletal: She exhibits no edema.  Skin:  Round rash on right upper arm      CBC    Component Value Date/Time   WBC 7.5 05/02/2014 1229   RBC 4.06 05/02/2014 1229   RBC 3.41* 08/06/2012 2149   HGB 12.9 05/02/2014 1229   HCT 36.9 05/02/2014 1229   PLT 245 05/02/2014 1229   MCV 90.9 05/02/2014 1229   LYMPHSABS 1.9 05/02/2014 1229   MONOABS 0.5 05/02/2014 1229   EOSABS 0.2 05/02/2014 1229   BASOSABS 0.1 05/02/2014 1229    CMP     Component Value Date/Time   NA 136 05/02/2014 1229   K 3.8 05/02/2014 1229   CL 102 05/02/2014 1229   CO2 25 05/02/2014 1229   GLUCOSE 141* 05/02/2014 1229   BUN 16 05/02/2014 1229   CREATININE 0.74 05/02/2014 1229   CREATININE 0.86 08/07/2012 0430   CALCIUM 9.2 05/02/2014 1229   PROT 6.8 05/02/2014 1229   ALBUMIN 4.1 05/02/2014 1229   AST 15 05/02/2014 1229   ALT 11 05/02/2014 1229   ALKPHOS 58 05/02/2014 1229   BILITOT 1.1 05/02/2014 1229   GFRNONAA >89 05/02/2014 1229   GFRNONAA 79* 08/07/2012 0430   GFRAA >89 05/02/2014 1229   GFRAA >90 08/07/2012 0430    Lab Results  Component Value Date/Time   CHOL 219* 01/05/2014 09:33 AM    No components found for: HGA1C  Lab Results  Component Value Date/Time   AST 15 05/02/2014 12:29 PM    Assessment and Plan  Essential hypertension - Plan: blood pressure is well-controlled continued current meds lisinopril-hydrochlorothiazide (PRINZIDE,ZESTORETIC) 10-12.5 MG per tablet, COMPLETE METABOLIC PANEL WITH GFR  Thyroid disease - Plan: levothyroxine (SYNTHROID, LEVOTHROID) 88 MCG tablet, we'll recheckTSH  Tinea corporis - Plan: clotrimazole-betamethasone (LOTRISONE) cream  Left foot pain - Plan: DG Foot Complete Left, ibuprofen (ADVIL,MOTRIN) 600 MG tablet  Special screening for malignant neoplasms, colon - Plan: MM DIGITAL SCREENING BILATERAL  Itching - Plan: cetirizine (ZYRTEC) 10 MG tablet   Health Maintenance  -Mammogram: ordered  -Vaccinations:  Patient declines flu shot   Return in about 3 months (around 11/14/2014) for hypertension,  hypothyroid.   This note has been created with Surveyor, quantity. Any transcriptional errors are unintentional.    Lorayne Marek, MD

## 2014-08-14 NOTE — Patient Instructions (Signed)
Plan de alimentacin DASH (DASH Eating Plan) DASH es la sigla en ingls de "Enfoques Alimentarios para Detener la Hipertensin". El plan de alimentacin DASH ha demostrado bajar la presin arterial elevada (hipertensin). Los beneficios adicionales para la salud pueden incluir la disminucin del riesgo de diabetes mellitus tipo2, enfermedades cardacas e ictus. Este plan tambin puede ayudar a Horticulturist, commercial. QU DEBO SABER ACERCA DEL PLAN DE ALIMENTACIN DASH? Para el plan de alimentacin DASH, seguir las siguientes pautas generales:  Elija los alimentos con un valor porcentual diario de sodio de menos del 5% (segn figura en la etiqueta del alimento).  Use hierbas o aderezos sin sal, en lugar de sal de mesa o sal marina.  Consulte al mdico o farmacutico antes de usar sustitutos de la sal.  Coma productos con bajo contenido de sodio, cuya etiqueta suele decir "bajo contenido de sodio" o "sin agregado de sal".  Coma alimentos frescos.  Coma ms verduras, frutas y productos lcteos con bajo contenido de [place].  Elija los cereales integrales. Busque la palabra "integral" en Equities trader de la lista de ingredientes.  Elija el pescado y el pollo o el pavo sin piel ms a menudo que las carnes rojas. Limite el consumo de pescado, carne de ave y carne a 6onzas (170g) por Training and development officer.  Limite el consumo de dulces, postres, azcares y bebidas azucaradas.  Elija las grasas saludables para el corazn.  Limite el consumo de queso a 1onza (28g) por Training and development officer.  Consuma ms comida casera y menos de restaurante, de buf y comida rpida.  Limite el consumo de alimentos fritos.  Cocine los alimentos utilizando mtodos que no sean la fritura.  Limite las verduras enlatadas. Si las consume, enjuguelas bien para disminuir el sodio.  Cuando coma en un restaurante, pida que preparen su comida con menos sal o, en lo posible, sin nada de sal. QU ALIMENTOS PUEDO COMER? Pida ayuda a un nutricionista para  conocer las necesidades calricas individuales. Cereales Pan de salvado o integral. Arroz integral. Pastas de salvado o integrales. Quinua, trigo burgol y cereales integrales. Cereales con bajo contenido de sodio. Tortillas de harina de maz o de salvado. Pan de maz integral. Galletas saladas integrales. Galletas con bajo contenido de [place]. Vegetales Verduras frescas o congeladas (crudas, al vapor, asadas o grilladas). Jugos de tomate y verduras con contenido bajo o reducido de sodio. Pasta y salsa de tomate con contenido bajo o [place]. Verduras enlatadas con bajo contenido de sodio o reducido de sodio.  Lambert Mody Lambert Mody frescas, en conserva (en su jugo natural) o frutas congeladas. Carnes y otros productos con protenas Carne de res molida (al 85% o ms [place]), carne de res de animales alimentados con pastos o carne de res sin la grasa. Pollo o pavo sin piel. Carne de pollo o de [place]. Cerdo sin la grasa. Todos los pescados y frutos de mar. Huevos. Porotos, guisantes o lentejas secos. Frutos secos y semillas sin sal. Frijoles enlatados sin sal. Lcteos Productos lcteos con bajo contenido de grasas, como [place] o al 1%, quesos reducidos en grasas o al 2%, ricota con bajo contenido de grasas o [employer], o yogur natural con bajo contenido de [place]. Quesos con contenido bajo o reducido de sodio. Grasas y Naval architect en barra que no contengan grasas trans. [place] y alios para ensaladas livianos o reducidos en grasas (reducidos en sodio). [place]. Aceites de crtamo, oliva o canola. [place] natural de man o almendra. Otros Palomitas de maz y pretzels sin sal.  Los artculos mencionados arriba pueden no ser [employer] de las bebidas o los alimentos recomendados. Comunquese con el nutricionista para conocer ms opciones. QU ALIMENTOS NO SE RECOMIENDAN? Cereales Pan blanco. Pastas blancas. Arroz blanco. Pan de maz refinado. Bagels y  croissants. Galletas saladas que contengan grasas trans. Vegetales Vegetales con crema o fritos. Verduras en [place]. Verduras enlatadas comunes. Pasta y salsa de tomate en lata comunes. Jugos comunes de tomate y de verduras. Lambert Mody Frutas secas. Fruta enlatada en almbar liviano o espeso. Jugo de frutas. Carnes y otros productos con protenas [place] de carne con Lobbyist. Costillas, alas de pollo, tocineta, salchicha, mortadela, salame, chinchulines, tocino, perros calientes, salchichas alemanas y embutidos envasados. Frutos secos y semillas con sal. Frijoles con sal en lata. Lcteos Leche entera o al 2%, crema, mezcla de [place] y crema, y queso crema. Yogur entero o endulzado. Quesos o queso azul con alto contenido de Physicist, medical. [place]rem[place] no lcteas y coberturas batidas. Quesos procesados, quesos para untar o cuajadas. Condimentos Sal de cebolla y ajo, sal condimentada, sal de mesa y sal marina. Salsas en lata y envasadas. Salsa [place]. Salsa trtara. Salsa barbacoa. Salsa teriyaki. Salsa de soja, incluso la que tiene contenido reducido de [place]. Salsa de carne. Salsa de pescado. Salsa de [place]. Salsa rosada. Rbano picante. Ketchup y mostaza. Saborizantes y tiernizantes para carne. Caldo en cubitos. Salsa picante. Salsa tabasco. Adobos. Aderezos para tacos. Salsas. Grasas y aceites [place], [place] en barra, [place] de [place], [place], [place] clarificada y Wendee Copp de tocino. Aceites de coco, de palmiste o de palma. Aderezos comunes para ensalada. Otros Pickles y [employer]. Palomitas de maz y pretzels con sal. Los artculos mencionados arriba pueden no ser [employer] de las bebidas y los alimentos que se Higher education careers adviser. Comunquese con el nutricionista para obtener ms informacin. DNDE Dolan Amen MS INFORMACIN? [place], del Pulmn y de la Sangre (National Heart, Lung, and [place]):  travelstabloid.com Document Released: 05/01/2011 Document Revised: 09/26/2013 [facility] Patient Information 2015 [place], [place]. This information is not intended to replace advice given to you by your health care provider. Make sure you discuss any questions you have with your health care provider.

## 2014-08-14 NOTE — Progress Notes (Signed)
Patient [name] with interpreter-Jamie [name] for follow up on her thyroid disease and HTN Requesting medication refill as well

## 2014-08-15 ENCOUNTER — Telehealth: Payer: Self-pay

## 2014-08-15 DIAGNOSIS — E079 Disorder of thyroid, unspecified: Secondary | ICD-10-CM

## 2014-08-15 MED ORDER — LEVOTHYROXINE SODIUM 100 MCG PO TABS
100.0000 ug | ORAL_TABLET | Freq: Every day | ORAL | Status: DC
Start: 2014-08-15 — End: 2016-07-28

## 2014-08-15 NOTE — Telephone Encounter (Signed)
-----   Message from Lorayne Marek, MD sent at 08/15/2014  9:24 AM EDT ----- Blood work reviewed noticed abnormal TSH level, currently patient is taking levothyroxine 88 mcg daily, advised patient to increase the dose to 100 mcg, will repeat her TSH level on the following visit. Also noted impaired fasting glucose, advise patient for low carbohydrate diet.

## 2014-08-15 NOTE — Telephone Encounter (Signed)
-----   Message from Lorayne Marek, MD sent at 08/14/2014  3:48 PM EDT ----- Call and let the patient know that her x-ray reported   IMPRESSION: No acute osseous abnormality.  First metatarsal phalangeal joint osteoarthritis.

## 2014-08-15 NOTE — Telephone Encounter (Signed)
Interpreter used-Jamie [name] Patient not available Message left on voice mail to return our call

## 2014-08-15 NOTE — Telephone Encounter (Signed)
Interpreter used-[place] Patient not available Left message on voice mail to return our call

## 2014-09-07 ENCOUNTER — Other Ambulatory Visit: Payer: Self-pay | Admitting: Internal Medicine

## 2014-09-07 DIAGNOSIS — Z1231 Encounter for screening mammogram for malignant neoplasm of breast: Secondary | ICD-10-CM

## 2014-09-14 ENCOUNTER — Ambulatory Visit (HOSPITAL_COMMUNITY)
Admission: RE | Admit: 2014-09-14 | Discharge: 2014-09-14 | Disposition: A | Discharge status: Home / Self Care | Payer: No Typology Code available for payment source | Source: Ambulatory Visit | Attending: Internal Medicine | Admitting: Internal Medicine

## 2014-09-14 DIAGNOSIS — Z1231 Encounter for screening mammogram for malignant neoplasm of breast: Secondary | ICD-10-CM | POA: Insufficient documentation

## 2014-10-20 ENCOUNTER — Ambulatory Visit: Payer: Self-pay | Admitting: Obstetrics & Gynecology

## 2015-05-16 NOTE — Telephone Encounter (Signed)
error 

## 2015-09-07 ENCOUNTER — Other Ambulatory Visit: Payer: Self-pay | Admitting: Internal Medicine

## 2016-07-28 ENCOUNTER — Encounter: Payer: Self-pay | Admitting: Physician Assistant

## 2016-07-28 ENCOUNTER — Ambulatory Visit (INDEPENDENT_AMBULATORY_CARE_PROVIDER_SITE_OTHER): Payer: Self-pay | Admitting: Physician Assistant

## 2016-07-28 VITALS — BP 116/66 | HR 68 | Temp 98.3°F | Resp 18 | Ht 61.0 in | Wt 186.0 lb

## 2016-07-28 DIAGNOSIS — Z833 Family history of diabetes mellitus: Secondary | ICD-10-CM

## 2016-07-28 DIAGNOSIS — M62838 Other muscle spasm: Secondary | ICD-10-CM

## 2016-07-28 DIAGNOSIS — E039 Hypothyroidism, unspecified: Secondary | ICD-10-CM

## 2016-07-28 DIAGNOSIS — Z79899 Other long term (current) drug therapy: Secondary | ICD-10-CM

## 2016-07-28 DIAGNOSIS — Z131 Encounter for screening for diabetes mellitus: Secondary | ICD-10-CM

## 2016-07-28 DIAGNOSIS — I10 Essential (primary) hypertension: Secondary | ICD-10-CM

## 2016-07-28 MED ORDER — LOSARTAN POTASSIUM-HCTZ 50-12.5 MG PO TABS: 1.0000 | ORAL_TABLET | Freq: Every day | ORAL | 3 refills | Status: DC

## 2016-07-28 MED ORDER — LEVOTHYROXINE SODIUM 100 MCG PO TABS: 100.0000 ug | ORAL_TABLET | Freq: Every day | ORAL | 6 refills | Status: DC

## 2016-07-28 MED ORDER — CYCLOBENZAPRINE HCL 10 MG PO TABS: 10.0000 mg | ORAL_TABLET | Freq: Three times a day (TID) | ORAL | 0 refills | Status: DC | PRN

## 2016-07-28 NOTE — Progress Notes (Signed)
Jamie [name]  MRN: [id number] DOB: 04-10-1966  PCP: Lorayne Marek, MD  Subjective:  Pt is a 51 year old female PMH HTN, reflux, hypothyroidism, anemia and obesity who presents to clinic for lab work. She speaks Spanish, she is here today with her daughter who is interpreting for for her.  She would like to be checked for thyroid levels and diabetes.She is feeling well today, no complaints.  Her last OV was 07/2014 with Dr. Vernon Prey at [facility]. Blood pressure at that OV 134/80. TSH was 4.7. She was asked to f/u 3 months later for f/u HTN and thyroid.  Last hemoglobin A1C was drawn in 04/2014 - 5.6% H/o HTN - Controlled with Hyzaar 50-12.5 mg. She takes the every morning after breakfast. Today's blood pressure is 116/66. Today's dose was her last dose.  Denies dizziness, lightheadedness, chest pain, SOB, syncope.  H/o hypothyroidism - Synthroid 100 mcg. Takes this every day.  Family history of diabetes - 2 sisters and father have diabetes.  H/o muscle spasms - she takes Cyclobenzaprine 10 mg PRN = a few times a week. Takes this at nighttime.  Helps her sleep. She brings with her today her empty Rx bottle from 01/2016 #30 refill 0. Her last OV was at the clinic next door to PCP. She did not like her experience there. She would like to establish care here.   There are a few care gaps for this pt including Tdap and colonoscopy.   Review of Systems  Constitutional: Negative for chills, diaphoresis, fatigue and fever.  Respiratory: Negative for cough, chest tightness, shortness of breath and wheezing.   Cardiovascular: Negative for chest pain, palpitations and leg swelling.  Gastrointestinal: Negative for abdominal pain, constipation, diarrhea, nausea and vomiting.  Endocrine: Negative for cold intolerance, heat intolerance, polydipsia, polyphagia and polyuria.  Neurological: Negative for dizziness, weakness and headaches.    Patient Active Problem List    Diagnosis Date Noted  . Esophageal reflux 01/05/2014  . Fibroids, subserous 10/28/2013  . Essential hypertension, benign 04/11/2013  . Anemia, iron deficiency 04/11/2013  . Obesity (BMI 30-39.9) 04/11/2013  . Abnormal uterine bleeding 08/09/2012  . Anemia 08/06/2012  . HTN (hypertension) 08/06/2012  . Tinea corporis 10/28/2010  . Unspecified hypothyroidism 03/28/2010    Current Outpatient Prescriptions on File Prior to Visit  Medication Sig Dispense Refill  . b complex vitamins capsule Take 1 capsule by mouth daily.    . cetirizine (ZYRTEC) 10 MG tablet Take 1 tablet (10 mg total) by mouth daily. 30 tablet 3  . clotrimazole-betamethasone (LOTRISONE) cream Apply 1 application topically 2 (two) times daily. 30 g 1  . ferrous sulfate 325 (65 FE) MG tablet Take 325 mg by mouth daily with breakfast.    . ibuprofen (ADVIL,MOTRIN) 600 MG tablet Take 1 tablet (600 mg total) by mouth every 8 (eight) hours as needed. 30 tablet 1  . levothyroxine (SYNTHROID, LEVOTHROID) 100 MCG tablet Take 1 tablet (100 mcg total) by mouth daily. 30 tablet 2  . ranitidine (ZANTAC) 300 MG tablet Take 1 tablet (300 mg total) by mouth as needed for heartburn. 60 tablet 0  . lisinopril-hydrochlorothiazide (PRINZIDE,ZESTORETIC) 10-12.5 MG per tablet Take 1 tablet by mouth daily. (Patient not taking: Reported on 07/28/2016) 90 tablet 3  . lisinopril-hydrochlorothiazide (PRINZIDE,ZESTORETIC) 10-12.5 MG per tablet Take 1 tablet by mouth daily. (Patient not taking: Reported on 07/28/2016) 30 tablet 3   No current facility-administered medications on file prior to visit.  No Known Allergies   Objective:  BP 116/66   Pulse 68   Temp 98.3 F (36.8 C) (Oral)   Resp 18   Ht '5\' 1"'$  (1.549 m)   Wt 186 lb (84.4 kg)   LMP 07/18/2016 (Approximate)   SpO2 98%   BMI 35.14 kg/m   Physical Exam  Constitutional: She is oriented to person, place, and time and well-developed, well-nourished, and in no distress. No distress.    Cardiovascular: Normal rate, regular rhythm and normal heart sounds.   Pulmonary/Chest: Effort normal. No respiratory distress.  Neurological: She is alert and oriented to person, place, and time. GCS score is 15.  Skin: Skin is warm and dry.  Psychiatric: Mood, memory, affect and judgment normal.  Vitals reviewed.   Assessment and Plan :  1. Encounter for medication management 2. Essential hypertension - CMP14+EGFR - losartan-hydrochlorothiazide (HYZAAR) 50-12.5 MG tablet; Take 1 tablet by mouth daily.  Dispense: 60 tablet; Refill: 3 - Pt feeling well today. Blood pressure is controlled. She plans to establish care here at PCP. Discussed with pt importance of continuity of care and encouraged her to receive care at one clinic. Advised RTC in 6 months for annual exam and medication refill. She understands and agrees with plan.  - Labs are pending, will contact with results and make medication adjustments as needed.  3. Hypothyroidism, unspecified type - TSH - levothyroxine (SYNTHROID, LEVOTHROID) 100 MCG tablet; Take 1 tablet (100 mcg total) by mouth daily.  Dispense: 30 tablet; Refill: 6  4. Family history of diabetes mellitus 5. Screening for diabetes mellitus - Hemoglobin A1c  6. Muscle spasm - cyclobenzaprine (FLEXERIL) 10 MG tablet; Take 1 tablet (10 mg total) by mouth 3 (three) times daily as needed for muscle spasms.  Dispense: 30 tablet; Refill: 0  Mercer Pod, PA-C  [facility] 07/28/2016 11:37 AM

## 2016-07-28 NOTE — Patient Instructions (Addendum)
Thank you for coming in today. I hope you feel we met your needs.  Feel free to call [facility] if you have any questions or further requests.  Please consider signing up for MyChart if you do not already have it, as this is a great way to communicate with me.  Best,  Whitney McVey, PA-C  Schedule an annual exam in 6 months. You are due for your tetanus vaccines and colonoscopy - please see below.  Please walk at least 20 minutes most days of the week. This will help your blood pressure, blood sugar and cholesterol levels.   Plan de alimentacin DASH (DASH Eating Plan) DASH es la sigla en ingls de "Enfoques Alimentarios para Detener la Hipertensin". El plan de alimentacin DASH ha demostrado bajar la presin arterial elevada (hipertensin). Los beneficios adicionales para la salud pueden incluir la disminucin del riesgo de diabetes mellitus tipo2, enfermedades cardacas e ictus. Este plan tambin puede ayudar a Horticulturist, commercial. QU DEBO SABER ACERCA DEL PLAN DE ALIMENTACIN DASH? Para el plan de alimentacin DASH, seguir las siguientes pautas generales:  Elija los alimentos que contienen menos de 150 miligramos de sodio por porcin (segn se indica en la etiqueta de los alimentos).  Use hierbas o aderezos sin sal, en lugar de sal de mesa o sal marina.  Consulte al mdico o farmacutico antes de usar sustitutos de la sal.  Consuma los productos con menor contenido de sodio. Estos productos suelen estar etiquetados como "bajo en sodio" o "sin agregado de sal".  Coma alimentos frescos. No consuma una gran cantidad de alimentos enlatados.  Coma ms verduras, frutas y productos lcteos con bajo contenido de [place].  Elija los cereales integrales. Busque la palabra "integral" en Equities trader de la lista de ingredientes.  Elija el pescado y el pollo o el pavo sin piel ms a menudo que las carnes rojas. Limite el consumo de pescado, carne de ave y carne a 6onzas (170g) por Training and development officer.  Limite el  consumo de dulces, postres, azcares y bebidas azucaradas.  Elija las grasas saludables para el corazn.  Consuma ms comida casera y menos de restaurante, de buf y comida rpida.  Limite el consumo de alimentos fritos.  No fra los alimentos. A la hora de cocinarlos, opte por hornearlos, hervirlos, grillarlos y asarlos a Administrator, arts.  Cuando coma en un restaurante, pida que preparen su comida con menos sal o, en lo posible, sin nada de sal. QU ALIMENTOS PUEDO COMER? Pida ayuda a un nutricionista para conocer las necesidades calricas individuales. Cereales Pan de salvado o integral. Arroz integral. Pastas de salvado o integrales. Quinua, trigo burgol y cereales integrales. Cereales con bajo contenido de sodio. Tortillas de harina de maz o de salvado. Pan de maz integral. Galletas saladas integrales. Galletas con bajo contenido de [place]. Vegetales Verduras frescas o congeladas (crudas, al vapor, asadas o grilladas). Jugos de tomate y verduras con contenido bajo o reducido de sodio. Pasta y salsa de tomate con contenido bajo o [place]. Verduras enlatadas con bajo contenido de sodio o reducido de sodio. Lambert Mody Lambert Mody frescas, en conserva (en su jugo natural) o frutas congeladas. Carnes y otros productos con protenas Carne de res molida (al 85% o ms [place]), carne de res de animales alimentados con pastos o carne de res sin la grasa. Pollo o pavo sin piel. Carne de pollo o de [place]. Cerdo sin la grasa. Todos los pescados y frutos de mar. Huevos. Porotos, guisantes o lentejas secos. Frutos secos y  semillas sin sal. Frijoles enlatados sin sal. Lcteos Productos lcteos con bajo contenido de grasas, como [place] o al 1%, quesos reducidos en grasas o al 2%, ricota con bajo contenido de grasas o [employer], o yogur natural con bajo contenido de [place]. Quesos con contenido bajo o reducido de sodio. Grasas y Naval architect en barra que no contengan grasas  trans. [place]ones[place] y alios para ensaladas livianos o reducidos en grasas (reducidos en sodio). [place]. Aceites de crtamo, oliva o canola. [place] natural de man o almendra. Otros Palomitas de maz y pretzels sin sal. Los artculos mencionados arriba pueden no ser [employer] de las bebidas o los alimentos recomendados. Comunquese con el nutricionista para conocer ms opciones. QU ALIMENTOS NO SE RECOMIENDAN? Cereales Pan blanco. Pastas blancas. Arroz blanco. Pan de maz refinado. Bagels y croissants. Galletas saladas que contengan grasas trans. Vegetales Vegetales con crema o fritos. Verduras en [place]. Verduras enlatadas comunes. Pasta y salsa de tomate en lata comunes. Jugos comunes de tomate y de verduras. Lambert Mody Fruta enlatada en almbar liviano o espeso. Jugo de frutas. Carnes y otros productos con protenas [place] de carne con Lobbyist. Costillas, alas de pollo, tocineta, salchicha, mortadela, salame, chinchulines, tocino, perros calientes, salchichas alemanas y embutidos envasados. Frutos secos y semillas con sal. Frijoles con sal en lata. Lcteos Leche entera o al 2%, crema, mezcla de [place] y crema, y queso crema. Yogur entero o endulzado. Quesos o queso azul con alto contenido de Physicist, medical. [place]remas no lcteas y coberturas batidas. Quesos procesados, quesos para untar o cuajadas. Condimentos Sal de cebolla y ajo, sal condimentada, sal de mesa y sal marina. Salsas en lata y envasadas. Salsa [place]. Salsa trtara. Salsa barbacoa. Salsa teriyaki. Salsa de soja, incluso la que tiene contenido reducido de [place]. Salsa de carne. Salsa de pescado. Salsa de [place]. Salsa rosada. Rbano picante. Ketchup y mostaza. Saborizantes y tiernizantes para carne. Caldo en cubitos. Salsa picante. Salsa tabasco. Adobos. Aderezos para tacos. Salsas. Grasas y aceites [place], [place] en barra, [place] de [place], [place], [place] clarificada y Wendee Copp de tocino. Aceites de coco, de  palmiste o de palma. Aderezos comunes para ensalada. Otros Pickles y [place]. Palomitas de maz y pretzels con sal. Los artculos mencionados arriba pueden no ser [employer] de las bebidas y los alimentos que se Higher education careers adviser. Comunquese con el nutricionista para obtener ms informacin. DNDE Dolan Amen MS INFORMACIN? [place], del Pulmn y de Herbalist (National Heart, Lung, and [place]): travelstabloid.com Esta informacin no tiene Marine scientist el consejo del mdico. Asegrese de hacerle al mdico cualquier pregunta que tenga. Document Released: 05/01/2011 Document Revised: 09/03/2015 Document Reviewed: 03/16/2013 Elsevier Interactive Patient Education  2017 [place] de deteccin de Surveyor, minerals (Colorectal Cancer Screening) Las pruebas de deteccin de cncer colorrectal se usan para confirmar o descartar este tipo de cncer. Colorrectal se refiere al colon y al recto. El colon y el recto, que se encuentran en el extremo del intestino grueso, tienen la funcin de eliminar las heces del cuerpo. POR QU SE HACEN LAS PRUEBAS DE DETECCIN DE CNCER COLORRECTAL? Es comn que se formen tumores anormales (plipos) en el revestimiento del colon, especialmente a medida que uno envejece. Estos plipos pueden ser cancerosos o convertirse en cancerosos. Si el cncer colorrectal se detecta en una etapa inicial, es tratable. QUIN DEBE HACERSE LAS PRUEBAS DE [place]? Se recomienda que todos los adultos de 50aos en adelante con un  riesgo promedio se hagan las pruebas de deteccin cada 1a 10aos. El mdico puede recomendar que estas se hagan antes o con ms frecuencia si usted tiene lo siguiente:  Antecedentes de plipos o cncer colorrectal.  Un familiar con antecedentes de plipos o cncer colorrectal.  Enfermedad inflamatoria intestinal, como la enfermedad de Crohn  o colitis ulcerosa.  Un tipo de sndrome de cncer de colon hereditario.  Sntomas de Surveyor, minerals. TIPOS DE PRUEBAS DE DETECCIN Hay diversos tipos de pruebas de deteccin de cncer colorrectal. Entre ellos, se incluyen los siguientes:  Prueba de sangre oculta en la materia fecal con guayacol.  Prueba inmunoqumica fecal (PIF).  Prueba de ADN en heces.  Enema de bario.  Colonoscopa virtual.  Sigmoidoscopia. Durante esta prueba, se utiliza un sigmoidoscopio para examinar el recto y el colon inferior. El sigmoidoscopio es un tubo flexible con una cmara que se introduce a travs del ano en el recto y el colon inferior.  Colonoscopa. Durante esta prueba, se utiliza un colonoscopio para examinar todo el colon. Un colonoscopio es un tubo prolongado, delgado y flexible con Ardelia Mems cmara, que se [place] para examinar todo el colon y el recto. Esta informacin no tiene Marine scientist el consejo del mdico. Asegrese de hacerle al mdico cualquier pregunta que tenga. Document Released: 02/04/2012 Document Revised: 06/02/2014 Document Reviewed: 08/18/2013 Elsevier Interactive Patient Education  2017 [employer].  IF you received an x-ray today, you will receive an invoice from [facility]. Please contact [facility] at [phone] with questions or concerns regarding your invoice.   IF you received labwork today, you will receive an invoice from [employer]. Please contact [facility] at [phone] with questions or concerns regarding your invoice.   Our billing staff will not be able to assist you with questions regarding bills from these companies.  You will be contacted with the lab results as soon as they are available. The fastest way to get your results is to activate your My Chart account. Instructions are located on the last page of this paperwork. If you have not heard from [place] regarding the results in 2 weeks, please contact this office.     ]

## 2016-07-29 LAB — CMP14+EGFR
ALT: 15 IU/L (ref 0–32)
AST: 17 IU/L (ref 0–40)
Albumin/Globulin Ratio: 1.6 (ref 1.2–2.2)
Albumin: 3.9 g/dL (ref 3.5–5.5)
Alkaline Phosphatase: 68 IU/L (ref 39–117)
BUN/Creatinine Ratio: 25 — ABNORMAL HIGH (ref 9–23)
BUN: 16 mg/dL (ref 6–24)
Bilirubin Total: 1 mg/dL (ref 0.0–1.2)
CO2: 22 mmol/L (ref 18–29)
Calcium: 8.7 mg/dL (ref 8.7–10.2)
Chloride: 102 mmol/L (ref 96–106)
Creatinine, Ser: 0.65 mg/dL (ref 0.57–1.00)
GFR calc Af Amer: 119 mL/min/{1.73_m2} (ref >59)
GFR calc non Af Amer: 103 mL/min/{1.73_m2} (ref >59)
Globulin, Total: 2.4 g/dL (ref 1.5–4.5)
Glucose: 112 mg/dL — ABNORMAL HIGH (ref 65–99)
Potassium: 3.8 mmol/L (ref 3.5–5.2)
Sodium: 139 mmol/L (ref 134–144)
Total Protein: 6.3 g/dL (ref 6.0–8.5)

## 2016-07-29 LAB — HEMOGLOBIN A1C
Est. average glucose Bld gHb Est-mCnc: 126 mg/dL
Hgb A1c MFr Bld: 6 % — ABNORMAL HIGH (ref 4.8–5.6)

## 2016-07-29 LAB — TSH: TSH: 2.26 u[IU]/mL (ref 0.450–4.500)

## 2016-08-04 ENCOUNTER — Encounter: Payer: Self-pay | Admitting: Physician Assistant

## 2017-02-02 ENCOUNTER — Ambulatory Visit: Payer: Self-pay | Admitting: Physician Assistant

## 2017-02-04 ENCOUNTER — Encounter: Payer: Self-pay | Admitting: Physician Assistant

## 2017-02-04 ENCOUNTER — Ambulatory Visit (INDEPENDENT_AMBULATORY_CARE_PROVIDER_SITE_OTHER): Payer: Self-pay | Admitting: Physician Assistant

## 2017-02-04 VITALS — BP 123/74 | HR 70 | Temp 98.1°F | Resp 16 | Ht 61.0 in | Wt 189.2 lb

## 2017-02-04 DIAGNOSIS — R7303 Prediabetes: Secondary | ICD-10-CM | POA: Insufficient documentation

## 2017-02-04 DIAGNOSIS — E039 Hypothyroidism, unspecified: Secondary | ICD-10-CM

## 2017-02-04 DIAGNOSIS — I10 Essential (primary) hypertension: Secondary | ICD-10-CM

## 2017-02-04 MED ORDER — LEVOTHYROXINE SODIUM 100 MCG PO TABS: 100.0000 ug | ORAL_TABLET | Freq: Every day | ORAL | 6 refills | Status: DC

## 2017-02-04 MED ORDER — LOSARTAN POTASSIUM-HCTZ 50-12.5 MG PO TABS: 1.0000 | ORAL_TABLET | Freq: Every day | ORAL | 3 refills | Status: DC

## 2017-02-04 NOTE — Patient Instructions (Addendum)
Come back and see me in 6 months.   Diabetes.org is a great resource to help prevent diabetes.  Please start walking most days of the week. What are the benefits of exercise?-Exercise has many benefits. It can: ?Burn calories, which helps people control their weight ?Help control blood sugar levels in people with diabetes ?Lower blood pressure, especially in people with high blood pressure ?Lower stress and help with depression ?Keep bones strong, so they don't get thin and break easily ?Lower the chance of dying from heart disease   What are the main types of exercise?-There are 3 main types of exercise. They are: ?Aerobic exercise - Aerobic exercise raises a person's heart rate. Examples of aerobic exercise are walking, running, or swimming. ?Resistance training - Resistance training helps make your muscles stronger. People can do this type of exercise using weights, exercise bands, or weight machines. ?Stretching - Stretching exercises help your muscles and joints move more easily. It's important to have all 3 types of exercise in your exercise program. That way, your body, muscles, and joints can be as healthy as possible.  For substantial health benefits, adults are recommended to perform moderate-intensity aerobic exercise or vigorous aerobic exercise as follows: ?Moderate-intensity aerobic exercise for 150 minutes every week AND muscle-strengthening activities involving all major muscle groups at least two days per week, OR ?Vigorous-intensity aerobic exercise for 75 minutes every week AND muscle-strengthening activities involving all major muscle groups at least two days per week, OR ?An equivalent mix of moderate- and vigorous-intensity aerobic exercise AND muscle-strengthening activities involving all major muscle groups at least two days per week  What should I do when I exercise?-Each time you exercise, you should: ?Warm up - Warming up can help keep you from hurting your  muscles when you exercise. To warm up, do a light aerobic exercise (such as walking slowly) or stretch for 5 to 10 minutes. ?Work out - During a workout, you can walk fast, swim, run, or use an exercise machine, for example. You should also stretch all of your joints, including your neck, shoulders, back, hips, and knees. At least 2 times a week, you can add resistance training exercises to your workout. ?Cool down - Cooling down helps keep you from feeling dizzy after you exercise and helps prevent muscle cramps. To cool down, you can stretch or do a light aerobic exercise for 5 minutes.  How often should I exercise?-Doctors recommend that people exercise at least 30 minutes a day, on 5 or more days of the week. If you can't exercise for 30 minutes straight, try to exercise for 10 minutes at a time, 3 or 4 times a day.  Thank you for coming in today. I hope you feel we met your needs.  Feel free to call PCP if you have any questions or further requests.  Please consider signing up for MyChart if you do not already have it, as this is a great way to communicate with me.  Best,  [employer], PA-C  Prediabetes Eating Plan Prediabetes-also called impaired glucose tolerance or impaired fasting glucose-is a condition that causes blood sugar (blood glucose) levels to be higher than normal. Following a healthy diet can help to keep prediabetes under control. It can also help to lower the risk of type 2 diabetes and heart disease, which are increased in people who have prediabetes. Along with regular exercise, a healthy diet:  Promotes weight loss.  Helps to control blood sugar levels.  Helps to improve the  way that the body uses insulin.  What do I need to know about this eating plan?  Use the glycemic index (GI) to plan your meals. The index tells you how quickly a food will raise your blood sugar. Choose low-GI foods. These foods take a longer time to raise blood sugar.  Pay close attention  to the amount of carbohydrates in the food that you eat. Carbohydrates increase blood sugar levels.  Keep track of how many calories you take in. Eating the right amount of calories will help you to achieve a healthy weight. Losing about 7 percent of your starting weight can help to prevent type 2 diabetes.  You may want to follow a Mediterranean diet. This diet includes a lot of vegetables, lean meats or fish, whole grains, fruits, and healthy oils and fats. What foods can I eat? Grains Whole grains, such as whole-wheat or whole-grain breads, crackers, cereals, and pasta. Unsweetened oatmeal. Bulgur. Barley. Quinoa. Brown rice. Corn or whole-wheat flour tortillas or taco shells. Vegetables Lettuce. Spinach. Peas. Beets. Cauliflower. Cabbage. Broccoli. Carrots. Tomatoes. Squash. Eggplant. Herbs. Peppers. Onions. Cucumbers. Brussels sprouts. Fruits Berries. Bananas. Apples. Oranges. Grapes. Papaya. Mango. Pomegranate. Kiwi. Grapefruit. Cherries. Meats and Other Protein Sources Seafood. Lean meats, such as chicken and [place] or lean cuts of pork and beef. Tofu. Eggs. Nuts. Beans. Dairy Low-fat or fat-free dairy products, such as yogurt, cottage cheese, and cheese. Beverages Water. Tea. Coffee. Sugar-free or diet soda. Seltzer water. Milk. Milk alternatives, such as soy or almond milk. Condiments Mustard. Relish. Low-fat, low-sugar ketchup. Low-fat, low-sugar barbecue sauce. Low-fat or fat-free mayonnaise. Sweets and Desserts Sugar-free or low-fat pudding. Sugar-free or low-fat ice cream and other frozen treats. Fats and Oils Avocado. Walnuts. Olive oil. The items listed above may not be a complete list of recommended foods or beverages. Contact your dietitian for more options. What foods are not recommended? Grains Refined white flour and flour products, such as bread, pasta, snack foods, and cereals. Beverages Sweetened drinks, such as sweet iced tea and soda. Sweets and Desserts Baked  goods, such as cake, cupcakes, pastries, cookies, and cheesecake. The items listed above may not be a complete list of foods and beverages to avoid. Contact your dietitian for more information. This information is not intended to replace advice given to you by your health care provider. Make sure you discuss any questions you have with your health care provider. Document Released: 09/26/2014 Document Revised: 10/18/2015 Document Reviewed: 06/07/2014 Elsevier Interactive Patient Education  2017 [employer].

## 2017-02-04 NOTE — Progress Notes (Signed)
Jamie [name]  MRN: [id number] DOB: [birth year]  PCP: Patient, No Pcp Per  Subjective:  Pt is a pleasant 51 year old female who presents to clinic for follow-up thyroid and HTN. She speaks Spanish and is here today with her daughter who is interpreting.  Last OV was 6 months ago.   HTN - Controlled on Hyzaar 50-12.5mg  qd.  Last dose was yesterday. Denies medication side effects. blood pressure is 123/74. Denies dizziness, lightheadedness, chest pain, SOB, syncope. She is not exercising.   Hypothyroidism - Synthroid 100 mcg. Takes this every day. Denies medication side effects. Denies weight gain, nervousness, change in bowel habits, hair or skin changes. Last dose was this morning.   Pre diabetes - last hemoglobin A1C was 6.0 on 3/5. She has not made any lifestyle changes.  FHx DM.   She is fasting this morning  She declinces flu shot.   Review of Systems  Constitutional: Negative for chills, diaphoresis, fatigue and fever.  Respiratory: Negative for cough, chest tightness, shortness of breath and wheezing.   Cardiovascular: Negative for chest pain and palpitations.  Gastrointestinal: Negative for abdominal pain, diarrhea, nausea and vomiting.  Endocrine: Negative for cold intolerance and heat intolerance.  Neurological: Negative for weakness, light-headedness and headaches.    Patient Active Problem List   Diagnosis Date Noted  . Esophageal reflux 01/05/2014  . Fibroids, subserous 10/28/2013  . Essential hypertension, benign 04/11/2013  . Anemia, iron deficiency 04/11/2013  . Obesity (BMI 30-39.9) 04/11/2013  . Tinea corporis 10/28/2010  . Hypothyroidism 03/28/2010    Current Outpatient Prescriptions on File Prior to Visit  Medication Sig Dispense Refill  . b complex vitamins capsule Take 1 capsule by mouth daily.    . cetirizine (ZYRTEC) 10 MG tablet Take 1 tablet (10 mg total) by mouth daily. 30 tablet 3  . clotrimazole-betamethasone (LOTRISONE) cream Apply 1  application topically 2 (two) times daily. 30 g 1  . cyclobenzaprine (FLEXERIL) 10 MG tablet Take 1 tablet (10 mg total) by mouth 3 (three) times daily as needed for muscle spasms. 30 tablet 0  . ferrous sulfate 325 (65 FE) MG tablet Take 325 mg by mouth daily with breakfast.    . ibuprofen (ADVIL,MOTRIN) 600 MG tablet Take 1 tablet (600 mg total) by mouth every 8 (eight) hours as needed. 30 tablet 1  . levothyroxine (SYNTHROID, LEVOTHROID) 100 MCG tablet Take 1 tablet (100 mcg total) by mouth daily. 30 tablet 6  . losartan-hydrochlorothiazide (HYZAAR) 50-12.5 MG tablet Take 1 tablet by mouth daily. 60 tablet 3  . ranitidine (ZANTAC) 300 MG tablet Take 1 tablet (300 mg total) by mouth as needed for heartburn. 60 tablet 0   No current facility-administered medications on file prior to visit.     No Known Allergies   Objective:  BP 123/74 (BP Location: Right Arm, Patient Position: Sitting, Cuff Size: Normal)   Pulse 70   Temp 98.1 F (36.7 C) (Oral)   Resp 16   Ht 5\' 1"  (1.549 m)   Wt 189 lb 3.2 oz (85.8 kg)   SpO2 98%   BMI 35.75 kg/m   Physical Exam  Constitutional: She is oriented to person, place, and time and well-developed, well-nourished, and in no distress. No distress.  Cardiovascular: Normal rate, regular rhythm and normal heart sounds.   Pulmonary/Chest: Effort normal. No respiratory distress.  Musculoskeletal:       Right ankle: She exhibits no swelling.       Left ankle: She exhibits no swelling.  Neurological: She is alert and oriented to person, place, and time. GCS score is 15.  Skin: Skin is warm and dry.  Psychiatric: Mood, memory, affect and judgment normal.  Vitals reviewed.  Lab Results  Component Value Date   TSH 2.260 07/28/2016   Lab Results  Component Value Date   HGBA1C 6.0 (H) 07/28/2016    Assessment and Plan :  1. Essential hypertension, benign - losartan-hydrochlorothiazide (HYZAAR) 50-12.5 MG tablet; Take 1 tablet by mouth daily.  Dispense:  60 tablet; Refill: 3 - Lipid panel - Blood pressure is well controlled. OK to refill medications. Screen lipids. Lab pending, will contact with results.   2. Hypothyroidism, unspecified type - levothyroxine (SYNTHROID, LEVOTHROID) 100 MCG tablet; Take 1 tablet (100 mcg total) by mouth daily.  Dispense: 30 tablet; Refill: 6 - TSH - Controlled. OK to refill medication. RTC in 6 months for recheck.   3. Prediabetes - Hemoglobin A1c - Encouraged pt to start exercising and cutting back on sugars. Labs are pending. Consider starting Metformin.   Mercer Pod, PA-C  [facility] 02/04/2017 11:39 AM

## 2017-02-05 LAB — HEMOGLOBIN A1C
Est. average glucose Bld gHb Est-mCnc: 143 mg/dL
Hgb A1c MFr Bld: 6.6 % — ABNORMAL HIGH (ref 4.8–5.6)

## 2017-02-05 LAB — LIPID PANEL
Chol/HDL Ratio: 4.7 ratio — ABNORMAL HIGH (ref 0.0–4.4)
Cholesterol, Total: 263 mg/dL — ABNORMAL HIGH (ref 100–199)
HDL: 56 mg/dL (ref >39)
LDL Calculated: 174 mg/dL — ABNORMAL HIGH (ref 0–99)
Triglycerides: 163 mg/dL — ABNORMAL HIGH (ref 0–149)
VLDL Cholesterol Cal: 33 mg/dL (ref 5–40)

## 2017-02-05 LAB — TSH: TSH: 3.21 u[IU]/mL (ref 0.450–4.500)

## 2017-02-09 ENCOUNTER — Encounter: Payer: Self-pay | Admitting: Physician Assistant

## 2017-02-09 NOTE — Progress Notes (Signed)
Letter sent to pt with results and recommendations. RTC in 6 months for recheck. Consider medications if no improvement

## 2017-03-31 ENCOUNTER — Telehealth: Payer: Self-pay | Admitting: General Practice

## 2017-03-31 NOTE — Telephone Encounter (Signed)
Returned call and left message that refill on Synthroid was called in in September with 6 refills.

## 2017-03-31 NOTE — Telephone Encounter (Signed)
Copied from [place] #[id number]. Topic: Quick Communication - See Telephone Encounter >> Mar 31, 2017  1:24 PM Arletha Grippe wrote: CRM for notification. See Telephone encounter for:  Daughter called she needs refill on thyroid medication- daughter did not have name of medicine.   Pharmacy is [place], [place] - [address]  Cb number [phone] for daughter   03/31/17.

## 2017-04-06 ENCOUNTER — Telehealth: Payer: Self-pay | Admitting: General Practice

## 2017-04-06 NOTE — Telephone Encounter (Signed)
Spoke with pharmacy patient wanted losartan not synthroid  Informed patient prescription is on hold waiting for pickup per al (pharmacy tech)

## 2017-04-06 NOTE — Telephone Encounter (Signed)
Copied from [place] [id number]. Topic: Inquiry >> Apr 06, 2017 10:21 AM Malena Catholic I, NT wrote: Reason for CRM: Pt call call for Rx refill for  Levothoid She state she went to the Pharmacy and she need to Nurse call Pharmacy for Thank

## 2017-07-03 ENCOUNTER — Other Ambulatory Visit: Payer: Self-pay

## 2017-07-03 ENCOUNTER — Encounter (HOSPITAL_COMMUNITY): Payer: Self-pay | Admitting: Emergency Medicine

## 2017-07-03 ENCOUNTER — Emergency Department (HOSPITAL_COMMUNITY): Payer: Self-pay

## 2017-07-03 DIAGNOSIS — J181 Lobar pneumonia, unspecified organism: Secondary | ICD-10-CM | POA: Insufficient documentation

## 2017-07-03 DIAGNOSIS — Z79899 Other long term (current) drug therapy: Secondary | ICD-10-CM | POA: Insufficient documentation

## 2017-07-03 DIAGNOSIS — I10 Essential (primary) hypertension: Secondary | ICD-10-CM | POA: Insufficient documentation

## 2017-07-03 DIAGNOSIS — E039 Hypothyroidism, unspecified: Secondary | ICD-10-CM | POA: Insufficient documentation

## 2017-07-03 NOTE — ED Triage Notes (Signed)
Reports head and chest congestion along with cough and ear pain for about a week.  Also reports running a fevers but has not checked.

## 2017-07-04 ENCOUNTER — Emergency Department (HOSPITAL_COMMUNITY)
Admission: EM | Admit: 2017-07-04 | Discharge: 2017-07-04 | Disposition: A | Discharge status: Home / Self Care | Payer: Self-pay | Attending: Emergency Medicine | Admitting: Emergency Medicine

## 2017-07-04 DIAGNOSIS — J189 Pneumonia, unspecified organism: Secondary | ICD-10-CM

## 2017-07-04 DIAGNOSIS — J181 Lobar pneumonia, unspecified organism: Secondary | ICD-10-CM

## 2017-07-04 MED ORDER — LEVOFLOXACIN 750 MG PO TABS
750.0000 mg | ORAL_TABLET | Freq: Once | ORAL | Status: AC
  Administered 2017-07-04: 750 mg via ORAL
  Filled 2017-07-04: qty 1

## 2017-07-04 MED ORDER — LEVOFLOXACIN 750 MG PO TABS: 750.0000 mg | ORAL_TABLET | Freq: Every day | ORAL | 0 refills | Status: DC

## 2017-07-04 NOTE — ED Provider Notes (Signed)
[facility] EMERGENCY DEPARTMENT Provider Note   CSN: [id number] Arrival date & time: 07/03/17  2215     History   Chief Complaint Chief Complaint  Patient presents with  . Nasal Congestion  . URI    HPI Jamie [name] is a 52 y.o. female.  Patient presents to the emergency department for evaluation of nasal congestion, ear pain, cough and chest congestion that has been ongoing for more than a week.  She thinks she has been experiencing fever because of chills but has not taken her temperature.  She reports a previous history of pneumonia with similar symptoms.      Past Medical History:  Diagnosis Date  . Blood type, Rh negative 06/26/2010  . GERD (gastroesophageal reflux disease)   . Herpes simplex without mention of complication   . HTN (hypertension)   . Hypercholesteremia   . Thyroid disease    hypothyroid    Patient Active Problem List   Diagnosis Date Noted  . Prediabetes 02/04/2017  . Esophageal reflux 01/05/2014  . Fibroids, subserous 10/28/2013  . Essential hypertension, benign 04/11/2013  . Anemia, iron deficiency 04/11/2013  . Obesity (BMI 30-39.9) 04/11/2013  . Tinea corporis 10/28/2010  . Hypothyroidism 03/28/2010    Past Surgical History:  Procedure Laterality Date  . CESAREAN SECTION    . TUBAL LIGATION      OB History    Gravida Para Term Preterm AB Living   8 6 6  0 2 6   SAB TAB Ectopic Multiple Live Births   2 0 0 0 5       Home Medications    Prior to Admission medications   Medication Sig Start Date End Date Taking? Authorizing Provider  b complex vitamins capsule Take 1 capsule by mouth daily.    [provider]  cetirizine (ZYRTEC) 10 MG tablet Take 1 tablet (10 mg total) by mouth daily. 08/14/14   Lorayne Marek, MD  clotrimazole-betamethasone (LOTRISONE) cream Apply 1 application topically 2 (two) times daily. 08/14/14   Lorayne Marek, MD  cyclobenzaprine (FLEXERIL) 10 MG tablet Take 1  tablet (10 mg total) by mouth 3 (three) times daily as needed for muscle spasms. 07/28/16   McVey, Gelene Mink, PA-C  ferrous sulfate 325 (65 FE) MG tablet Take 325 mg by mouth daily with breakfast.    [provider]  ibuprofen (ADVIL,MOTRIN) 600 MG tablet Take 1 tablet (600 mg total) by mouth every 8 (eight) hours as needed. 08/14/14   Lorayne Marek, MD  levofloxacin (LEVAQUIN) 750 MG tablet Take 1 tablet (750 mg total) by mouth daily. 07/04/17   Orpah Greek, MD  levothyroxine (SYNTHROID, LEVOTHROID) 100 MCG tablet Take 1 tablet (100 mcg total) by mouth daily. 02/04/17   McVey, Gelene Mink, PA-C  losartan-hydrochlorothiazide (HYZAAR) 50-12.5 MG tablet Take 1 tablet by mouth daily. 02/04/17   McVey, Gelene Mink, PA-C  ranitidine (ZANTAC) 300 MG tablet Take 1 tablet (300 mg total) by mouth as needed for heartburn. 01/05/14   Lorayne Marek, MD    Family History Family History  Problem Relation Age of Onset  . Heart disease Father   . Diabetes Sister   . Diabetes Sister     Social History Social History   Tobacco Use  . Smoking status: Never Smoker  . Smokeless tobacco: Never Used  Substance Use Topics  . Alcohol use: No  . Drug use: No     Allergies   Patient has no known allergies.   Review  of Systems Review of Systems  HENT: Positive for congestion.   Respiratory: Positive for cough.   All other systems reviewed and are negative.    Physical Exam Updated Vital Signs BP 131/78 (BP Location: Right Arm)   Pulse 82   Temp 97.9 F (36.6 C) (Oral)   Resp 18   Ht 5\' 3"  (1.6 m)   Wt 85.7 kg (189 lb)   SpO2 97%   BMI 33.48 kg/m   Physical Exam  Constitutional: She is oriented to person, place, and time. She appears well-developed and well-nourished. No distress.  HENT:  Head: Normocephalic and atraumatic.  Right Ear: Hearing normal.  Left Ear: Hearing normal.  Nose: Nose normal.  Mouth/Throat: Oropharynx is clear and moist and  mucous membranes are normal.  Eyes: Conjunctivae and EOM are normal. Pupils are equal, round, and reactive to light.  Neck: Normal range of motion. Neck supple.  Cardiovascular: Regular rhythm, S1 normal and S2 normal. Exam reveals no gallop and no friction rub.  No murmur heard. Pulmonary/Chest: Effort normal. No respiratory distress. She has rhonchi in the right middle field and the right lower field. She exhibits no tenderness.  Abdominal: Soft. Normal appearance and bowel sounds are normal. There is no hepatosplenomegaly. There is no tenderness. There is no rebound, no guarding, no tenderness at McBurney's point and negative Murphy's sign. No hernia.  Musculoskeletal: Normal range of motion.  Neurological: She is alert and oriented to person, place, and time. She has normal strength. No cranial nerve deficit or sensory deficit. Coordination normal. GCS eye subscore is 4. GCS verbal subscore is 5. GCS motor subscore is 6.  Skin: Skin is warm, dry and intact. No rash noted. No cyanosis.  Psychiatric: She has a normal mood and affect. Her speech is normal and behavior is normal. Thought content normal.  Nursing note and vitals reviewed.    ED Treatments / Results  Labs (all labs ordered are listed, but only abnormal results are displayed) Labs Reviewed - No data to display  EKG  EKG Interpretation None       Radiology Dg Chest 2 View  Result Date: 07/03/2017 CLINICAL DATA:  Productive cough EXAM: CHEST  2 VIEW COMPARISON:  None. FINDINGS: Mild right middle lobe infiltrate. Tiny pleural effusions. Cardiomegaly with mild vascular congestion. Bronchitic changes in the lower lungs. No pneumothorax. IMPRESSION: Mild right middle lobe pulmonary infiltrate. Radiographic follow-up to resolution recommended. Cardiomegaly with mild vascular congestion Electronically Signed   By: Donavan Foil M.D.   On: 07/03/2017 23:08    Procedures Procedures (including critical care time)  Medications  Ordered in ED Medications  levofloxacin (LEVAQUIN) tablet 750 mg (not administered)     Initial Impression / Assessment and Plan / ED Course  I have reviewed the triage vital signs and the nursing notes.  Pertinent labs & imaging results that were available during my care of the patient were reviewed by me and considered in my medical decision making (see chart for details).     Patient with 1 week history of cough and congestion, upper respiratory infection symptoms.  She is afebrile at arrival with normal vital signs including normal oxygenation on room air.  Chest x-ray, however, does reveal right middle lobe pneumonia.  Patient initiated on Levaquin.  Final Clinical Impressions(s) / ED Diagnoses   Final diagnoses:  Community acquired pneumonia of right middle lobe of lung [facility])    ED Discharge Orders        Ordered  levofloxacin (LEVAQUIN) 750 MG tablet  Daily     07/04/17 0228       Orpah Greek, MD 07/04/17 [phone]

## 2017-07-06 ENCOUNTER — Ambulatory Visit: Payer: Self-pay | Admitting: Family Medicine

## 2017-07-09 ENCOUNTER — Encounter: Payer: Self-pay | Admitting: Family Medicine

## 2017-07-09 ENCOUNTER — Ambulatory Visit: Payer: Self-pay | Admitting: Family Medicine

## 2017-07-09 ENCOUNTER — Other Ambulatory Visit: Payer: Self-pay

## 2017-07-09 VITALS — BP 120/74 | HR 91 | Temp 98.3°F | Ht 63.78 in | Wt 182.0 lb

## 2017-07-09 DIAGNOSIS — J189 Pneumonia, unspecified organism: Secondary | ICD-10-CM

## 2017-07-09 DIAGNOSIS — J181 Lobar pneumonia, unspecified organism: Secondary | ICD-10-CM

## 2017-07-09 LAB — POCT CBC
Granulocyte percent: 68.4 %G (ref 37–80)
HCT, POC: 36.7 % — AB (ref 37.7–47.9)
Hemoglobin: 13 g/dL (ref 12.2–16.2)
Lymph, poc: 2.4 (ref 0.6–3.4)
MCH, POC: 32.4 pg — AB (ref 27–31.2)
MCHC: 35.3 g/dL (ref 31.8–35.4)
MCV: 91.7 fL (ref 80–97)
MID (cbc): 0.5 (ref 0–0.9)
MPV: 7.2 fL (ref 0–99.8)
POC Granulocyte: 6.2 (ref 2–6.9)
POC LYMPH PERCENT: 26.4 %L (ref 10–50)
POC MID %: 5.2 %M (ref 0–12)
Platelet Count, POC: 311 10*3/uL (ref 142–424)
RBC: 4 M/uL — AB (ref 4.04–5.48)
RDW, POC: 11.6 %
WBC: 9.1 10*3/uL (ref 4.6–10.2)

## 2017-07-09 NOTE — Progress Notes (Signed)
2/14/201911:05 AM  Ok Jamie [name] January 15, 1966, 52 y.o. female [id number]  Chief Complaint  Patient presents with  . Follow-up    says the Pneumonia hs come bk since last visit and back pain  . Medication Refill    LEVOFLOXACIN 750MG  IF STILL NEEDED    HPI:   Patient is a 52 y.o. female with past medical history significant for HTN and hypothyroidism who presents today for follow-up on her pna. She completed last dose of levaquin today. Feeling about 50% better. Cough resolved. Still having nasal congestion and stuffy ears. Worried because was having chills last night. Denies any frank fevers, SOB. Fatigue is getting better.   Seen in ED 07/04/17 - RML/RLL PNA, per xray Has had PNA twice in the past 5 years  Depression screen [facility] 2/9 07/09/2017 02/04/2017 07/28/2016  Decreased Interest 0 0 0  Down, Depressed, Hopeless 0 0 0  PHQ - 2 Score 0 0 0    No Known Allergies  Prior to Admission medications   Medication Sig Start Date End Date Taking? Authorizing Provider  levofloxacin (LEVAQUIN) 750 MG tablet Take 1 tablet (750 mg total) by mouth daily. 07/04/17  Yes Pollina, Gwenyth Allegra, MD  levothyroxine (SYNTHROID, LEVOTHROID) 100 MCG tablet Take 1 tablet (100 mcg total) by mouth daily. 02/04/17  Yes McVey, Gelene Mink, PA-C  losartan-hydrochlorothiazide (HYZAAR) 50-12.5 MG tablet Take 1 tablet by mouth daily. 02/04/17  Yes McVey, Gelene Mink, PA-C  ranitidine (ZANTAC) 300 MG tablet Take 1 tablet (300 mg total) by mouth as needed for heartburn. 01/05/14  Yes Lorayne Marek, MD    Past Medical History:  Diagnosis Date  . Blood type, Rh negative 06/26/2010  . GERD (gastroesophageal reflux disease)   . Herpes simplex without mention of complication   . HTN (hypertension)   . Hypercholesteremia   . Thyroid disease    hypothyroid    Past Surgical History:  Procedure Laterality Date  . CESAREAN SECTION    . TUBAL LIGATION      Social History   Tobacco Use  .  Smoking status: Never Smoker  . Smokeless tobacco: Never Used  Substance Use Topics  . Alcohol use: No    Family History  Problem Relation Age of Onset  . Heart disease Father   . Diabetes Sister   . Diabetes Sister     ROS Per hpi  OBJECTIVE:  Blood pressure 120/74, pulse 91, temperature 98.3 F (36.8 C), temperature source Oral, height 5' 3.78" (1.62 m), weight 182 lb (82.6 kg), SpO2 97 %.  Physical Exam  Constitutional: She is oriented to person, place, and time and well-developed, well-nourished, and in no distress.  HENT:  Head: Normocephalic and atraumatic.  Right Ear: Hearing, tympanic membrane, external ear and ear canal normal.  Left Ear: Hearing, tympanic membrane, external ear and ear canal normal.  Mouth/Throat: Oropharynx is clear and moist.  Eyes: EOM are normal. Pupils are equal, round, and reactive to light.  Neck: Neck supple.  Cardiovascular: Normal rate, regular rhythm and normal heart sounds. Exam reveals no gallop and no friction rub.  No murmur heard. Pulmonary/Chest: Effort normal and breath sounds normal. She has no wheezes. She has no rales.  Lymphadenopathy:    She has no cervical adenopathy.  Neurological: She is alert and oriented to person, place, and time. Gait normal.  Skin: Skin is warm and dry.      Results for orders placed or performed in visit on 07/09/17 (from the past 24 hour(s))  POCT CBC     Status: Abnormal   Collection Time: 07/09/17 11:38 AM  Result Value Ref Range   WBC 9.1 4.6 - 10.2 K/uL   Lymph, poc 2.4 0.6 - 3.4   POC LYMPH PERCENT 26.4 10 - 50 %L   MID (cbc) 0.5 0 - 0.9   POC MID % 5.2 0 - 12 %M   POC Granulocyte 6.2 2 - 6.9   Granulocyte percent 68.4 37 - 80 %G   RBC 4.00 (A) 4.04 - 5.48 M/uL   Hemoglobin 13.0 12.2 - 16.2 g/dL   HCT, POC 36.7 (A) 37.7 - 47.9 %   MCV 91.7 80 - 97 fL   MCH, POC 32.4 (A) 27 - 31.2 pg   MCHC 35.3 31.8 - 35.4 g/dL   RDW, POC 11.6 %   Platelet Count, POC 311 142 - 424 K/uL   MPV  7.2 0 - 99.8 fL    ASSESSMENT and PLAN  1. Community acquired pneumonia of right lower lobe of lung ([place]) Resolving, has completed abx. Normal WBC today, exam and VSS. Continue with supportive measures. RTC precautions reviewed.  - POCT CBC  Return if symptoms worsen or fail to improve.    Rutherford Guys, MD [facility],  [address] Ph.  [phone] Fax [phone]

## 2017-07-09 NOTE — Patient Instructions (Addendum)
     IF you received an x-ray today, you will receive an invoice from [facility] Radiology. Please contact [facility] at [phone] with questions or concerns regarding your invoice.   IF you received labwork today, you will receive an invoice from [facility]. Please contact [facility] at [phone] with questions or concerns regarding your invoice.   Our billing staff will not be able to assist you with questions regarding bills from these companies.  You will be contacted with the lab results as soon as they are available. The fastest way to get your results is to activate your My Chart account. Instructions are located on the last page of this paperwork. If you have not heard from [place] regarding the results in 2 weeks, please contact this office.    Neumona extrahospitalaria en los adultos (Community-Acquired Pneumonia, Adult) La neumona es una infeccin en los pulmones. La neumona puede contagiarse mientras una persona est hospitalizada. Cuando una persona no est en el hospital, puede tener un tipo diferente de la enfermedad (neumona extrahospitalaria), la cual se transmite fcilmente de Ardelia Mems persona a Theatre manager. Una persona puede contraer neumona extrahospitalaria si respira cerca de un individuo infectado que tose o estornuda. Algunos sntomas incluyen lo siguiente:  Tos seca.  Tos con expectoracin (productiva).  Cristy Hilts.  Sudoracin.  Dolor en el pecho. [place] los medicamentos de venta libre y los recetados solamente como se lo haya indicado el mdico. ? [place] los medicamentos para la tos solamente si no puede dormir bien. ? Si le recetaron un antibitico, tmelo como se lo haya indicado el mdico. No deje de tomar los antibiticos aunque comience a Sports administrator.  Duerma con la cabeza y el cuello elevados. Para lograrlo, colquese algunas almohadas debajo de la cabeza, o bien puede dormir en un silln reclinable.  No consuma productos que  contengan tabaco. Estos incluyen cigarrillos, tabaco para mascar y Psychologist, sport and exercise. Si necesita ayuda para dejar de fumar, consulte al mdico.  Beba suficiente agua para mantener la orina clara o de color amarillo plido. Una vacuna puede ayudar a prevenir la neumona. Habitualmente, la vacuna se recomienda a:  Las [employer] de 85OYD.  Las [employer] de 19aos: ? Las personas que estn recibiendo tratamiento para Science writer. ? [employer] tienen una enfermedad pulmonar a largo plazo (crnica). ? Las [employer] tienen problemas del sistema de defensa del organismo (sistema inmunitario). Tambin puede evitar la neumona si toma las siguientes medidas:  Se aplica la vacuna antigripal todos los [place].  Visita al dentista con la frecuencia indicada.  Se lava las manos a menudo. Utilice un desinfectante para manos si no dispone de [place]. SOLICITE AYUDA SI:  Tiene fiebre.  No puede dormir bien porque el medicamento para la tos no resulta eficaz. SOLICITE AYUDA DE INMEDIATO SI:  Le falta el aire y este sntoma [place].  Aumenta el dolor en el pecho.  La enfermedad empeora. Esto es muy grave si: ? Es Media planner. ? El sistema de defensa del organismo est debilitado.  Tose y escupe sangre. Esta informacin no tiene Marine scientist el consejo del mdico. Asegrese de hacerle al mdico cualquier pregunta que tenga. Document Released: 10/30/2009 Document Revised: 01/31/2015 Document Reviewed: 09/06/2014 Elsevier Interactive Patient Education  [employer].

## 2017-07-15 ENCOUNTER — Telehealth: Payer: Self-pay | Admitting: Physician Assistant

## 2017-07-15 NOTE — Telephone Encounter (Signed)
Called pt to let her know that we need to reschedule her appt for 08/04/17 with McVey.   When pt calls in, please reschedule her for her convenience with McVey for a 6 month F/U for thyroid.  Thanks!

## 2017-08-04 ENCOUNTER — Ambulatory Visit: Payer: Self-pay | Admitting: Physician Assistant

## 2017-08-18 ENCOUNTER — Encounter: Payer: Self-pay | Admitting: Family Medicine

## 2017-08-18 ENCOUNTER — Ambulatory Visit (INDEPENDENT_AMBULATORY_CARE_PROVIDER_SITE_OTHER): Payer: Self-pay | Admitting: Family Medicine

## 2017-08-18 ENCOUNTER — Other Ambulatory Visit: Payer: Self-pay

## 2017-08-18 VITALS — BP 132/84 | HR 86 | Temp 98.5°F | Ht 64.0 in | Wt 184.0 lb

## 2017-08-18 DIAGNOSIS — E785 Hyperlipidemia, unspecified: Secondary | ICD-10-CM

## 2017-08-18 DIAGNOSIS — M7701 Medial epicondylitis, right elbow: Secondary | ICD-10-CM

## 2017-08-18 DIAGNOSIS — R7303 Prediabetes: Secondary | ICD-10-CM

## 2017-08-18 DIAGNOSIS — I10 Essential (primary) hypertension: Secondary | ICD-10-CM

## 2017-08-18 DIAGNOSIS — E039 Hypothyroidism, unspecified: Secondary | ICD-10-CM

## 2017-08-18 MED ORDER — LEVOTHYROXINE SODIUM 100 MCG PO TABS: 100.0000 ug | ORAL_TABLET | Freq: Every day | ORAL | 6 refills | Status: AC

## 2017-08-18 MED ORDER — LOSARTAN POTASSIUM-HCTZ 50-12.5 MG PO TABS
1.0000 | ORAL_TABLET | Freq: Every day | ORAL | 6 refills | Status: DC
Start: 2017-08-18 — End: 2017-09-30

## 2017-08-18 NOTE — Patient Instructions (Addendum)
1. Para el codo, ponerse hielo por 20 minutos 2-3 veces al dia. tomar ibuprofen 600mg  cada 8 horas cuando sea necesario para el dolor o la inflamacion, comprarse el "brace" que le comente.    IF you received an x-ray today, you will receive an invoice from [facility]. Please contact [facility] at [phone] with questions or concerns regarding your invoice.   IF you received labwork today, you will receive an invoice from [place]. Please contact [facility] at [phone] with questions or concerns regarding your invoice.   Our billing staff will not be able to assist you with questions regarding bills from these companies.  You will be contacted with the lab results as soon as they are available. The fastest way to get your results is to activate your My Chart account. Instructions are located on the last page of this paperwork. If you have not heard from [place] regarding the results in 2 weeks, please contact this office.     Diabetes mellitus y nutricin Diabetes Mellitus and Nutrition Si sufre de diabetes (diabetes mellitus), es muy importante tener hbitos alimenticios saludables debido a que sus niveles de Designer, television/film set sangre (glucosa) se ven afectados en gran medida por lo que come y bebe. [place]er alimentos saludables en las cantidades [place], aproximadamente a la [employer], [place] ayudar a:  Aeronautical engineer glucemia.  Disminuir el riesgo de sufrir una enfermedad cardaca.  Mejorar la presin arterial.  Science writer o mantener un peso saludable.  Todas las personas que sufren de diabetes son diferentes y cada una tiene necesidades diferentes en cuanto a un plan de alimentacin. El mdico puede recomendarle que trabaje con un especialista en dietas y nutricin (nutricionista) para Financial trader plan para usted. Su plan de alimentacin puede variar segn factores como:  Las caloras que necesita.  Los medicamentos que toma.  Su peso.  Sus niveles de  glucemia, presin arterial y colesterol.  Su nivel de [place].  Otras afecciones que tenga, como enfermedades cardacas o renales.  Cmo me afectan los carbohidratos? Los carbohidratos afectan el nivel de glucemia ms que cualquier otro tipo de alimento. La ingesta de carbohidratos naturalmente aumenta la cantidad glucosa en la sangre. El recuento de carbohidratos es un mtodo destinado a Catering manager un registro de la cantidad de carbohidratos que se ingieren. El recuento de carbohidratos es importante para Theatre manager la glucemia a un nivel saludable, en especial si utiliza insulina o toma determinados medicamentos por va oral para la diabetes. Es importante saber la cantidad de carbohidratos que se pueden ingerir en cada comida sin correr Engineer, manufacturing. Esto es Psychologist, forensic. El nutricionista puede ayudarlo a calcular la cantidad de carbohidratos que debe ingerir en cada comida y colacin. Los alimentos que contienen carbohidratos incluyen:  Pan, cereal, arroz, pasta y galletas.  Papas y maz.  Guisantes, frijoles y lentejas.  Leche y Estate agent.  Lambert Mody y [place].  Postres, como pasteles, galletitas, helado y caramelos.  Cmo me afecta el alcohol? El alcohol puede provocar disminuciones sbitas de la glucemia (hipoglucemia), en especial si utiliza insulina o toma determinados medicamentos por va oral para la diabetes. La hipoglucemia es una afeccin potencialmente mortal. Los sntomas de la hipoglucemia (somnolencia, mareos y confusin) son similares a los sntomas de haber consumido demasiado alcohol. Si el mdico afirma que el alcohol es seguro para usted, siga estas pautas:  Limite el consumo de alcohol a no ms de 1 medida por da si es mujer y no est [place],  y a 2 medidas si es hombre. Una medida equivale a 12oz (3110ml) de cerveza, 5oz (157ml) de vino o 1oz (19ml) de bebidas de alta graduacin alcohlica.  No beba con el estmago vaco.  Mantngase hidratado con  agua, gaseosas dietticas o t helado sin azcar.  Tenga en cuenta que las gaseosas comunes, los jugos y otros refrescos pueden contener mucha azcar y se deben contar como carbohidratos.  Consejos para seguir Photographer las etiquetas de los alimentos  Comience por controlar el tamao de la porcin en la etiqueta. La cantidad de caloras, carbohidratos, grasas y otros nutrientes mencionados en la etiqueta se basan en una porcin del alimento. Muchos alimentos contienen ms de una porcin por envase.  Verifique la cantidad total de gramos (g) de carbohidratos totales en una porcin. Puede calcular la cantidad de porciones de carbohidratos al dividir el total de carbohidratos por 15. Por ejemplo, si un alimento posee un total de 30g de carbohidratos, equivale a 2 porciones de carbohidratos.  Verifique la cantidad de gramos (g) de grasas saturadas y grasas trans en una porcin. Escoja alimentos que no contengan grasa o que tengan un bajo contenido.  Controle la cantidad de miligramos (mg) de sodio en una porcin. La mayora de las personas deben limitar la ingesta de sodio total a menos de 2300mg  por Training and development officer.  Siempre consulte la informacin nutricional de los alimentos etiquetados como "con bajo contenido de grasa" o "sin grasa". Estos alimentos pueden ser ms altos en azcar agregada o en carbohidratos refinados y deben evitarse.  Hable con el nutricionista para identificar sus objetivos diarios en cuanto a los nutrientes mencionados en la etiqueta. De compras  Evite comprar alimentos procesados, enlatados o prehechos. Estos alimentos tienden a TEFL teacher cantidad de [place], sodio y azcar agregada.  Compre en la zona exterior de la tienda de comestibles. Esta incluye frutas y [employer], granos a granel, carnes frescas y productos lcteos frescos. Coccin  Utilice mtodos de coccin a baja temperatura, como hornear, en lugar de mtodos de coccin a alta temperatura, como frer en  abundante aceite.  Cocine con aceites saludables, como el aceite de [place], canola o [place].  Evite cocinar con manteca, crema o carnes con alto contenido de grasa. Planificacin de las comidas  International [employer] comidas y las colaciones de forma regular, preferentemente a la misma hora todos [place]. Evite pasar largos perodos de tiempo sin comer.  Consuma alimentos ricos en fibra, como frutas frescas, verduras, frijoles y cereales integrales. Consulte al nutricionista sobre cuntas porciones de carbohidratos puede consumir en cada comida.  Consuma entre 4 y 6 onzas de protenas magras por da, como carnes [place], pollo, pescado, [employer] o tofu. 1 onza equivale a 1 onza de carne, pollo o pescado, 1 huevo, o 1/4 taza de tofu.  Coma algunos alimentos por da que contengan grasas saludables, como aguacates, frutos secos, semillas y pescado. Estilo de vida   Controle su nivel de glucemia con regularidad.  Haga ejercicio al menos 46minutos, 5das o ms por semana, o como se lo haya indicado el mdico.  Tome los [employer] se lo haya indicado el mdico.  No consuma ningn producto que contenga nicotina o tabaco, como cigarrillos y Psychologist, sport and exercise. Si necesita ayuda para dejar de fumar, consulte al [employer] con un asesor o instructor en diabetes para identificar estrategias para controlar el estrs y cualquier desafo emocional y social. Cules son algunas de las preguntas que puedo hacerle a mi mdico?  Es  necesario que me rena con Radio broadcast assistant en diabetes?  Es necesario que me rena con un nutricionista?  A qu nmero puedo llamar si tengo preguntas?  Cules son los mejores momentos para controlar la glucemia? Dnde encontrar ms informacin:  Asociacin Americana de la Diabetes (American Diabetes Association): diabetes.org/food-and-fitness/food  Academia de Nutricin y Information systems manager (Academy of Nutrition and Dietetics):  PokerClues.dk  [place] Diabetes y [place] y Water quality scientist [facility] of Diabetes and Digestive and Kidney Diseases) ([place], NIH): ContactWire.be Resumen  Un plan de alimentacin saludable lo ayudar a Aeronautical engineer glucemia y Theatre manager un estilo de vida saludable.  Trabajar con un especialista en dietas y nutricin (nutricionista) puede ayudarlo a Insurance claims handler de alimentacin para usted.  Tenga en cuenta que los carbohidratos y el alcohol tienen efectos inmediatos en sus niveles de glucemia. Es importante contar los carbohidratos y consumir alcohol con prudencia. Esta informacin no tiene Marine scientist el consejo del mdico. Asegrese de hacerle al mdico cualquier pregunta que tenga. Document Released: 08/19/2007 Document Revised: 09/01/2016 Document Reviewed: 09/01/2016 Elsevier Interactive Patient Education  2018 [employer].  Diabetes mellitus y actividad fsica (Diabetes Mellitus and Exercise) Hacer actividad fsica habitualmente es importante para el estado de salud general, en especial si tiene diabetes (diabetes mellitus). La actividad fsica no solo se reduce a Pharmacist, hospital. Aporta muchos beneficios para la salud, como aumentar la fuerza muscular y la densidad sea, y reducir las grasas corporales y Dealer. Esto mejora el estado fsico, la flexibilidad y la resistencia, y todo ello redunda en un mejor estado de salud general. La actividad fsica tiene beneficios adicionales para los diabticos, entre ellos:  Disminuye el apetito.  Ayuda a bajar y Consulting civil engineer glucemia bajo control.  [place] la presin arterial.  Ayuda a controlar las cantidades de sustancias grasas (lpidos) en la [place], como el colesterol y los triglicridos.  Mejora la respuesta del cuerpo a la insulina  (optimizacin de la sensibilidad a la insulina).  Reduce la cantidad de insulina que el cuerpo necesita.  Reduce el riesgo de sufrir cardiopata coronaria de la siguiente forma: ? [employer] de colesterol y triglicridos. ? Aumenta los niveles de colesterol bueno. ? Disminuye la glucemia. QU ES EL PLAN DE ACTIVIDADES? El mdico o un educador para la diabetes certificado pueden ayudarlo a Engineer, petroleum del tipo y de la frecuencia de actividad fsica (plan de actividades) adecuado para usted. Asegrese de lo siguiente:  Haga por lo menos 139minutos semanales de ejercicios de intensidad moderada o vigorosa. Estos podran ser caminatas dinmicas, ciclismo o [place]. ? Haga ejercicios de elongacin y de fortalecimiento, como yoga o levantamiento de pesas, por lo menos 2veces por semana. ? Reparta la actividad en al menos 3das de la semana.  Haga algn tipo de actividad fsica [employer]. ? No deje pasar ms de 2das seguidos sin hacer algn tipo de actividad fsica. ? No permanezca inactivo durante ms de 3minutos seguidos. Tmese descansos frecuentes para caminar o estirarse.  Elija un tipo de ejercicio o de actividad que disfrute y establezca objetivos realistas.  Comience lentamente y aumente de [place] gradual la intensidad del ejercicio con el correr del [place]. QU DEBO SABER ACERCA DEL CONTROL DE LA DIABETES?  Contrlese la glucemia antes y despus de ejercitarse. ? Si la glucemia supera los 240mg /dl (13,46mmol/l) antes de ejercitarse, hgase un control de la orina para detectar la presencia de cetonas. Si tiene [employer]  en la orina, no se ejercite hasta que la glucemia se normalice.  Conozca los sntomas de la glucemia baja (hipoglucemia) y aprenda cmo tratarla. El riesgo de tener hipoglucemia [place] durante y despus de hacer actividad fsica. Los sntomas frecuentes de hipoglucemia pueden incluir los  siguientes: ? Hambre. ? Ansiedad. ? Sudoracin y [employer]. ? Confusin. ? Mareos o sensacin de desvanecimiento. ? Aumento de la frecuencia cardaca o palpitaciones. ? Visin borrosa. ? Hormigueo o adormecimiento alrededor Exxon [employer]il Corporation, los labios o la [place]. ? Temblores o sacudidas. ? Irritabilidad.  Tenga una colacin de carbohidratos de accin rpida disponible antes, durante y despus de ejercitarse, a fin de evitar o tratar la hipoglucemia.  Evite inyectarse insulina en las zonas del cuerpo que ejercitar. Por ejemplo, evite inyectarse insulina en: ? Los brazos, si juega al tenis. ? Las piernas, si corre.  Lleve registros de sus hbitos de actividad fsica. Esto puede ayudarlos a usted y al mdico a Tax adviser de control de la diabetes segn sea necesario. Escriba los siguientes datos: ? Los alimentos que consume antes y despus de Field seismologist actividad fsica. ? Los niveles de glucosa en la sangre antes y despus de hacer ejercicios. ? El tipo y cantidad de [place] fsica que Musician. ? Cuando se prev que la insulina alcance su valor mximo, si [place] insulina. No haga actividad fsica en los momentos en que insulina alcanza su valor mximo.  Cuando comience un ejercicio o una actividad nuevos, trabaje con el mdico para asegurarse de que la actividad sea segura para usted y para Secretary/administrator la [place], los medicamentos o la ingesta de alimentos segn sea necesario.  Beba gran cantidad de agua mientras hace ejercicios para evitar la deshidratacin o los golpes de Freight forwarder. Beba suficiente lquido para Consulting civil engineer orina clara o de color amarillo plido. Esta informacin no tiene Marine scientist el consejo del mdico. Asegrese de hacerle al mdico cualquier pregunta que tenga. Document Released: 06/01/2007 Document Revised: 09/03/2015 Document Reviewed: 10/22/2015 Elsevier Interactive Patient Education  [employer].

## 2017-08-18 NOTE — Progress Notes (Signed)
3/26/201910:56 AM  Jamie [name] [birth year], 52 y.o. female [id number]  Chief Complaint  Patient presents with  . Follow-up    follow up appt for throid and blood pressure check. Needs refill on Levothyroxine 100 and Lorsartan 50-12.5    HPI:   Patient is a 52 y.o. female with past medical history significant for HTN and hypothyroidism who presents today for routine followup  She reports having received lab letter but not really understanding the results She however has been trying to be healthier in general, eating less, walking more Has lost 5 lbs Taking rx meds without any issues Concerned about intermittent medial elbow pain, right side, no trauma, no swelling. Just bothers her at times. Cant think of any aggravating or relieving factors.   a1c 6.6 in 01/2017 LDL 174 in 01/2017  Depression screen [facility] 2/9 08/18/2017 07/09/2017 02/04/2017  Decreased Interest 0 0 0  Down, Depressed, Hopeless 0 0 0  PHQ - 2 Score 0 0 0    No Known Allergies  Prior to Admission medications   Medication Sig Start Date End Date Taking? Authorizing Provider  levofloxacin (LEVAQUIN) 750 MG tablet Take 1 tablet (750 mg total) by mouth daily. 07/04/17  Yes Pollina, Gwenyth Allegra, MD  levothyroxine (SYNTHROID, LEVOTHROID) 100 MCG tablet Take 1 tablet (100 mcg total) by mouth daily. 02/04/17  Yes McVey, Gelene Mink, PA-C  losartan-hydrochlorothiazide (HYZAAR) 50-12.5 MG tablet Take 1 tablet by mouth daily. 02/04/17  Yes McVey, Gelene Mink, PA-C  ranitidine (ZANTAC) 300 MG tablet Take 1 tablet (300 mg total) by mouth as needed for heartburn. 01/05/14  Yes Lorayne Marek, MD    Past Medical History:  Diagnosis Date  . Blood type, Rh negative 06/26/2010  . GERD (gastroesophageal reflux disease)   . Herpes simplex without mention of complication   . HTN (hypertension)   . Hypercholesteremia   . Thyroid disease    hypothyroid    Past Surgical History:  Procedure Laterality Date  .  CESAREAN SECTION    . TUBAL LIGATION      Social History   Tobacco Use  . Smoking status: Never Smoker  . Smokeless tobacco: Never Used  Substance Use Topics  . Alcohol use: No    Family History  Problem Relation Age of Onset  . Heart disease Father   . Diabetes Sister   . Diabetes Sister     Review of Systems  Constitutional: Negative for chills and fever.  Respiratory: Negative for cough and shortness of breath.   Cardiovascular: Negative for chest pain, palpitations and leg swelling.  Gastrointestinal: Negative for abdominal pain, nausea and vomiting.     OBJECTIVE:  Blood pressure 132/84, pulse 86, temperature 98.5 F (36.9 C), temperature source Oral, height 5\' 4"  (1.626 m), weight 184 lb (83.5 kg), SpO2 98 %.  Wt Readings from Last 3 Encounters:  08/18/17 184 lb (83.5 kg)  07/09/17 182 lb (82.6 kg)  07/03/17 189 lb (85.7 kg)    Physical Exam  Constitutional: She is oriented to person, place, and time and well-developed, well-nourished, and in no distress.  HENT:  Head: Normocephalic and atraumatic.  Mouth/Throat: Oropharynx is clear and moist. No oropharyngeal exudate.  Eyes: Pupils are equal, round, and reactive to light. EOM are normal. No scleral icterus.  Neck: Neck supple. No thyromegaly present.  Cardiovascular: Normal rate, regular rhythm and normal heart sounds. Exam reveals no gallop and no friction rub.  No murmur heard. Pulmonary/Chest: Effort normal and breath sounds normal. She has  no wheezes. She has no rales.  Musculoskeletal: She exhibits no edema.  Msk: TTP over medial epicodyle, no swelling or ertheyma, normal ROM, no weakness  Neurological: She is alert and oriented to person, place, and time. Gait normal.  Skin: Skin is warm and dry.    ASSESSMENT and PLAN  1. Essential hypertension, benign At goal, cont current medication - Comprehensive metabolic panel - losartan-hydrochlorothiazide (HYZAAR) 50-12.5 MG tablet; Take 1 tablet by  mouth daily.  2. Hypothyroidism, unspecified type Checking level today, adjustments will be done if needed - TSH - levothyroxine (SYNTHROID, LEVOTHROID) 100 MCG tablet; Take 1 tablet (100 mcg total) by mouth daily.  3. Prediabetes importance of low carb diet, regular exercise and healthy weight.  - Hemoglobin A1c  4. Hyperlipidemia, unspecified hyperlipidemia type - Lipid panel  5. Medial epicondylitis of elbow, right Discussed supportive measures, ice, nsaids and brace  Return in about 3 months (around 11/18/2017) for fu on DM and cholesterol.    Rutherford Guys, MD Primary Care at [place], [place] [address] Ph.  [phone] Fax [phone]

## 2017-08-19 LAB — COMPREHENSIVE METABOLIC PANEL
ALT: 14 IU/L (ref 0–32)
AST: 21 IU/L (ref 0–40)
Albumin/Globulin Ratio: 1.7 (ref 1.2–2.2)
Albumin: 3.8 g/dL (ref 3.5–5.5)
Alkaline Phosphatase: 72 IU/L (ref 39–117)
BUN/Creatinine Ratio: 23 (ref 9–23)
BUN: 18 mg/dL (ref 6–24)
Bilirubin Total: 1 mg/dL (ref 0.0–1.2)
CO2: 21 mmol/L (ref 20–29)
Calcium: 9.4 mg/dL (ref 8.7–10.2)
Chloride: 102 mmol/L (ref 96–106)
Creatinine, Ser: 0.79 mg/dL (ref 0.57–1.00)
GFR calc Af Amer: 100 mL/min/{1.73_m2} (ref >59)
GFR calc non Af Amer: 86 mL/min/{1.73_m2} (ref >59)
Globulin, Total: 2.3 g/dL (ref 1.5–4.5)
Glucose: 125 mg/dL — ABNORMAL HIGH (ref 65–99)
Potassium: 3.8 mmol/L (ref 3.5–5.2)
Sodium: 136 mmol/L (ref 134–144)
Total Protein: 6.1 g/dL (ref 6.0–8.5)

## 2017-08-19 LAB — LIPID PANEL
Chol/HDL Ratio: 5.1 ratio — ABNORMAL HIGH (ref 0.0–4.4)
Cholesterol, Total: 249 mg/dL — ABNORMAL HIGH (ref 100–199)
HDL: 49 mg/dL (ref >39)
LDL Calculated: 162 mg/dL — ABNORMAL HIGH (ref 0–99)
Triglycerides: 188 mg/dL — ABNORMAL HIGH (ref 0–149)
VLDL Cholesterol Cal: 38 mg/dL (ref 5–40)

## 2017-08-19 LAB — HEMOGLOBIN A1C
Est. average glucose Bld gHb Est-mCnc: 128 mg/dL
Hgb A1c MFr Bld: 6.1 % — ABNORMAL HIGH (ref 4.8–5.6)

## 2017-08-19 LAB — TSH: TSH: 3.28 u[IU]/mL (ref 0.450–4.500)

## 2017-09-13 ENCOUNTER — Encounter: Payer: Self-pay | Admitting: Family Medicine

## 2017-09-23 LAB — HM MAMMOGRAPHY

## 2017-09-30 ENCOUNTER — Telehealth: Payer: Self-pay

## 2017-09-30 MED ORDER — LISINOPRIL-HYDROCHLOROTHIAZIDE 20-12.5 MG PO TABS: 1.0000 | ORAL_TABLET | Freq: Every day | ORAL | 3 refills | Status: DC

## 2017-09-30 NOTE — Telephone Encounter (Signed)
Please let patient that I have sent a rx for lisinopril-hctz. Please have her come back in about 2 weeks for a nurse BP check. thanks

## 2017-09-30 NOTE — Telephone Encounter (Signed)
Pt came into office re refill of Losartan/HCTZ.  States it was cheaper but is now too expensive.  Spoke with pharmacy.  They had recall on different manufacturer and now only have this option available, over $100 per refill.  Pt states only goes down $2 with [facility].  Pt would like to know if you would be able to prescribe a different medication to replace it.

## 2017-09-30 NOTE — Addendum Note (Signed)
Addended by: Rutherford Guys on: 09/30/2017 07:13 PM   Modules accepted: Orders

## 2017-10-01 NOTE — Telephone Encounter (Signed)
Left detailed VM advising pt.  

## 2017-10-13 ENCOUNTER — Ambulatory Visit: Payer: Self-pay

## 2017-10-13 ENCOUNTER — Telehealth: Payer: Self-pay

## 2017-10-13 NOTE — Telephone Encounter (Signed)
Pt came in today to do BP chk 146/78 was the reading in the left arm, sitting using normal size cuff.

## 2017-10-14 NOTE — Telephone Encounter (Signed)
Noted. Slightly above goal.   BP Readings from Last 3 Encounters:  08/18/17 132/84  07/09/17 120/74  07/04/17 129/79    Will re-evaluate at next visit in June and adjust medication as needed.

## 2017-11-02 ENCOUNTER — Encounter: Payer: Self-pay | Admitting: *Deleted

## 2017-11-20 ENCOUNTER — Ambulatory Visit (INDEPENDENT_AMBULATORY_CARE_PROVIDER_SITE_OTHER): Payer: Self-pay | Admitting: Family Medicine

## 2017-11-20 ENCOUNTER — Encounter: Payer: Self-pay | Admitting: Family Medicine

## 2017-11-20 ENCOUNTER — Other Ambulatory Visit: Payer: Self-pay

## 2017-11-20 VITALS — BP 102/72 | HR 76 | Temp 98.5°F | Ht 64.57 in | Wt 178.0 lb

## 2017-11-20 DIAGNOSIS — R7303 Prediabetes: Secondary | ICD-10-CM

## 2017-11-20 DIAGNOSIS — E039 Hypothyroidism, unspecified: Secondary | ICD-10-CM

## 2017-11-20 DIAGNOSIS — H6983 Other specified disorders of Eustachian tube, bilateral: Secondary | ICD-10-CM

## 2017-11-20 DIAGNOSIS — Z1211 Encounter for screening for malignant neoplasm of colon: Secondary | ICD-10-CM

## 2017-11-20 DIAGNOSIS — E785 Hyperlipidemia, unspecified: Secondary | ICD-10-CM

## 2017-11-20 DIAGNOSIS — I10 Essential (primary) hypertension: Secondary | ICD-10-CM

## 2017-11-20 LAB — LIPID PANEL
Chol/HDL Ratio: 4.8 ratio — ABNORMAL HIGH (ref 0.0–4.4)
Cholesterol, Total: 273 mg/dL — ABNORMAL HIGH (ref 100–199)
HDL: 57 mg/dL (ref >39)
LDL Calculated: 192 mg/dL — ABNORMAL HIGH (ref 0–99)
Triglycerides: 122 mg/dL (ref 0–149)
VLDL Cholesterol Cal: 24 mg/dL (ref 5–40)

## 2017-11-20 MED ORDER — CYCLOBENZAPRINE HCL 10 MG PO TABS: 10.0000 mg | ORAL_TABLET | Freq: Three times a day (TID) | ORAL | 0 refills | Status: DC | PRN

## 2017-11-20 MED ORDER — FLUTICASONE PROPIONATE 50 MCG/ACT NA SUSP
1.0000 | Freq: Two times a day (BID) | NASAL | 6 refills | Status: DC
Start: 2017-11-20 — End: 2018-05-31

## 2017-11-20 NOTE — Progress Notes (Signed)
6/28/201911:23 AM  Ok Edwards [birth year], 52 y.o. female [id number]  Chief Complaint  Patient presents with  . Follow-up    chronic conditions    HPI:   Patient is a 52 y.o. female with past medical history significant for HTN, HLP, prediabetes, hypothyroidism who presents today for follow-up of chronic conditions  She reports continued LFM, eating less and healthier, walking daily She is taking bp and thyroid meds as rx. Denies any side effects She is due for pap - self pay She has had her mammogram may 2019 - normal She has not done colon cancer screening She is also c/o bilateral ear pressure, muffled hearing, nasal congestion.  She also is having intermittent jaw pain, she reports she tends to clench her jaw and in the past has been giving medication for when it is hurting her  Fall Risk  11/20/2017 08/18/2017 07/09/2017 02/04/2017 07/28/2016  Falls in the past year? No No No No Yes  Number falls in past yr: - - - - 1  Injury with Fall? - - - - No     Depression screen [facility] 2/9 11/20/2017 08/18/2017 07/09/2017  Decreased Interest 0 0 0  Down, Depressed, Hopeless 0 0 0  PHQ - 2 Score 0 0 0    No Known Allergies  Prior to Admission medications   Medication Sig Start Date End Date Taking? Authorizing Provider  levothyroxine (SYNTHROID, LEVOTHROID) 100 MCG tablet Take 1 tablet (100 mcg total) by mouth daily. 08/18/17  Yes Rutherford Guys, MD  lisinopril-hydrochlorothiazide (ZESTORETIC) 20-12.5 MG tablet Take 1 tablet by mouth daily. 09/30/17  Yes Rutherford Guys, MD    Past Medical History:  Diagnosis Date  . Blood type, Rh negative 06/26/2010  . GERD (gastroesophageal reflux disease)   . Herpes simplex without mention of complication   . HTN (hypertension)   . Hypercholesteremia   . Thyroid disease    hypothyroid    Past Surgical History:  Procedure Laterality Date  . CESAREAN SECTION    . TUBAL LIGATION      Social History   Tobacco Use  . Smoking  status: Never Smoker  . Smokeless tobacco: Never Used  Substance Use Topics  . Alcohol use: No    Family History  Problem Relation Age of Onset  . Heart disease Father   . Diabetes Sister   . Diabetes Sister     Review of Systems  Constitutional: Negative for chills, fever and malaise/fatigue.  Respiratory: Negative for cough and shortness of breath.   Cardiovascular: Negative for chest pain, palpitations and leg swelling.  Gastrointestinal: Negative for abdominal pain, nausea and vomiting.   Per hpi  OBJECTIVE:  Blood pressure 102/72, pulse 76, temperature 98.5 F (36.9 C), temperature source Oral, height 5' 4.57" (1.64 m), weight 178 lb (80.7 kg), SpO2 99 %.  Wt Readings from Last 3 Encounters:  11/20/17 178 lb (80.7 kg)  08/18/17 184 lb (83.5 kg)  07/09/17 182 lb (82.6 kg)    Physical Exam  Constitutional: She is oriented to person, place, and time. She appears well-developed and well-nourished.  HENT:  Head: Normocephalic and atraumatic.  Right Ear: Hearing, tympanic membrane, external ear and ear canal normal.  Left Ear: Hearing, tympanic membrane, external ear and ear canal normal.  Nose: Mucosal edema and rhinorrhea present. Right sinus exhibits no maxillary sinus tenderness and no frontal sinus tenderness. Left sinus exhibits no maxillary sinus tenderness and no frontal sinus tenderness.  Mouth/Throat: Oropharynx is clear and moist.  +  mild TMJ TTP, right  Eyes: Pupils are equal, round, and reactive to light. EOM are normal.  Neck: Neck supple.  Cardiovascular: Normal rate, regular rhythm and normal heart sounds. Exam reveals no gallop and no friction rub.  No murmur heard. Pulmonary/Chest: Effort normal and breath sounds normal. She has no wheezes. She has no rales.  Lymphadenopathy:    She has no cervical adenopathy.  Neurological: She is alert and oriented to person, place, and time.  Skin: Skin is warm and dry.  Psychiatric: She has a normal mood and  affect.  Nursing note and vitals reviewed.    ASSESSMENT and PLAN  1. Essential hypertension, benign Controlled. Continue current regime.  - Comprehensive metabolic panel; Future  2. Prediabetes Last a1c 6.1 in march 2019. Continue with LFM and weight loss, recheck A1c at next visit - Hemoglobin A1c; Future  3. Hyperlipidemia, unspecified hyperlipidemia type Last LDL 162, cont with LFM, rechecking today. - Lipid Panel - Comprehensive metabolic panel; Future - Lipid panel; Future  4. Screening for colon cancer - IFOBT POC (occult bld, rslt in office); Future  5. Hypothyroidism, unspecified type Last TSH at goal. No symptoms. Controlled. Continue current regime. Recheck TSH at next visit - TSH; Future  6. Dysfunction of both eustachian tubes Discussed supportive measures, new meds r/se/b and RTC precautions. Patient educational handout given. Also discussed contribution of TMJ to symptoms. - fluticasone (FLONASE) 50 MCG/ACT nasal spray; Place 1 spray into both nostrils 2 (two) times daily. - cyclobenzaprine (FLEXERIL) 10 MG tablet; Take 1 tablet (10 mg total) by mouth 3 (three) times daily as needed for muscle spasms.  Return in about 3 months (around 02/20/2018) for chronic conditions.    Rutherford Guys, MD [facility] [place], [place] [address] Ph.  [phone] Fax [phone]

## 2017-11-20 NOTE — Patient Instructions (Addendum)
Favor de venir en ayuna una semana antes de nuestra proxima cita para que se haga sus pruebas de sangre  Llamar a Restaurant manager, fast food para su papanicolao Breast and Cervical Cancer Program for [employer]: 1. [place], Coordinator, [phone] 2. [place] [facility], Randel Pigg, Cordinator, [phone]    IF you received an x-ray today, you will receive an invoice from [facility] Radiology. Please contact [facility] Radiology at [phone] with questions or concerns regarding your invoice.   IF you received labwork today, you will receive an invoice from [facility]. Please contact [facility] at [phone] with questions or concerns regarding your invoice.   Our billing staff will not be able to assist you with questions regarding bills from these companies.  You will be contacted with the lab results as soon as they are available. The fastest way to get your results is to activate your My Chart account. Instructions are located on the last page of this paperwork. If you have not heard from [place] regarding the results in 2 weeks, please contact this office.     Disfuncin de la trompa de Eustaquio (Eustachian Tube Dysfunction) La trompa de Eustaquio conecta el odo medio con la parte posterior de la nariz. Regula la presin de Medical laboratory scientific officer odo medio al permitir que el aire circule por el odo y la [place]. Tambin ayuda a Musician lquido del espacio del odo [place]. Cuando la trompa de Eustaquio no funciona bien, se puede producir una acumulacin de presin de aire, lquido o ambos en el odo medio. La disfuncin de la trompa de Eustaquio puede Print production planner a uno o a [employer]. CAUSAS Esta afeccin ocurre cuando la trompa de [place] se bloquea o no puede abrirse normalmente. Puede ser consecuencia de lo siguiente:  Infecciones en los odos.  Resfriados y otras infecciones de las vas respiratorias superiores.  Alergias.  Irritacin, por ejemplo, por el humo del  cigarrillo o los cidos del estmago que vuelven hacia el esfago (reflujo gastroesofgico).  Cambios sbitos en la presin del aire, como cuando baja un avin.  Crecimientos anormales en la nariz o la garganta, como plipos nasales, tumores o tejido engrosado en la parte posterior de la garganta (adenoides). FACTORES DE RIESGO Puede ser ms probable que esta afeccin se desarrolle en las personas que fuman y las personas que tienen sobrepeso. Tambin es ms probable que la disfuncin de la trompa de Arts administrator se produzca en los nios, especialmente los nios que presentan lo siguiente:  Ciertos defectos congnitos en la boca, como fisura del paladar.  [place]dalas y [place]. SNTOMAS Los sntomas de esta afeccin pueden incluir lo siguiente:  Sensacin de que el odo est tapado.  Dolor de odo.  Ruidos como chasquidos o crujidos en el odo.  Zumbidos en el odo.  Prdida auditiva.  Prdida del equilibrio. Los sntomas pueden empeorar cuando la presin que tiene a su alrededor [place], como cuando viaja a una zona de mayor altura o en avin. DIAGNSTICO Esta afeccin se puede diagnosticar en funcin de lo siguiente:  Sus sntomas.  Un examen fsico del odo, de la [place] y de Patent examiner.  Pruebas en las que se determine lo siguiente: ? El movimiento de la membrana del tmpano (timpanograma). ? La audicin Lorel [place]). [place] causa y de la gravedad de Engineer, manufacturing systems. Si los sntomas son leves, es posible que pueda aliviarlos haciendo circular aire ("destapar") dentro de los odos. Si tiene sntomas de [employer],  el tratamiento puede incluir lo siguiente:  Descongestivos.  Antihistamnicos.  Aerosoles nasales o gotas para los odos que contengan medicamentos para reducir la hinchazn (corticoides). En algunos casos, puede necesitar un procedimiento para drenar el lquido de la membrana del tmpano (miringotoma). En este  procedimiento, se coloca un tubo pequeo en la membrana del tmpano para hacer lo siguiente:  Drenar el lquido.  Restablecer el aire en el espacio del odo medio. [place] los medicamentos de venta libre y los recetados solamente como se lo haya indicado el mdico.  Utilice las tcnicas recomendadas por el mdico para ayudar a Environmental health practitioner los odos. Estas pueden incluir las siguientes: ? Proofreader. ? Bostezos. ? Tragar vigorosamente con frecuencia. ? Cerrar la boca, taparse la nariz y soplar suavemente por la nariz como si tratara de soltar el aire.  No haga ninguna de estas cosas hasta que el mdico lo autorice: ? Viajar a grandes alturas. ? Viajar en avin. ? Trabajar en una cabina o una habitacin presurizada. ? Practicar buceo.  Mantener secos los odos. Squese bien los odos despus de ducharse o darse un bao.  No fume.  Concurra a todas las visitas de control como se lo haya indicado el mdico. Esto es importante. SOLICITE ATENCIN MDICA SI:  Los sntomas no desaparecen despus del tratamiento.  Los sntomas regresan despus del tratamiento.  No puede destaparse los odos.  Tiene los siguientes sntomas: ? Cristy Hilts. ? Dolor en el odo. ? Dolor de cabeza o en el cuello. ? Hay lquido que sale del odo.  La audicin cambia de repente.  Se siente muy mareado.  Pierde el equilibrio. Esta informacin no tiene Marine scientist el consejo del mdico. Asegrese de hacerle al mdico cualquier pregunta que tenga. Document Released: 01/09/2016 Document Revised: 01/09/2016 Document Reviewed: 05/31/2014 Elsevier Interactive Patient Education  [employer].

## 2017-12-02 MED ORDER — ATORVASTATIN CALCIUM 40 MG PO TABS: 40.0000 mg | ORAL_TABLET | Freq: Every day | ORAL | 1 refills | Status: AC

## 2017-12-02 NOTE — Addendum Note (Signed)
Addended by: Rutherford Guys on: 12/02/2017 05:11 PM   Modules accepted: Orders

## 2017-12-04 ENCOUNTER — Telehealth: Payer: Self-pay | Admitting: Family Medicine

## 2017-12-04 NOTE — Telephone Encounter (Signed)
Patient notified of results and medication sent to pharmacy using interpreter service. # [id number] She will put reminder in her phone to come in for lab recheck

## 2018-02-19 ENCOUNTER — Ambulatory Visit: Payer: Self-pay | Admitting: Family Medicine

## 2018-03-08 ENCOUNTER — Other Ambulatory Visit: Payer: Self-pay

## 2018-03-12 LAB — CYTOLOGY - PAP: DIAGNOSIS: NEGATIVE

## 2018-05-05 ENCOUNTER — Other Ambulatory Visit: Payer: Self-pay

## 2018-05-05 ENCOUNTER — Ambulatory Visit: Payer: Self-pay | Admitting: Family Medicine

## 2018-05-05 ENCOUNTER — Encounter: Payer: Self-pay | Admitting: Family Medicine

## 2018-05-05 VITALS — BP 120/80 | HR 86 | Temp 98.6°F | Resp 20 | Ht 63.15 in | Wt 176.8 lb

## 2018-05-05 DIAGNOSIS — R05 Cough: Secondary | ICD-10-CM

## 2018-05-05 DIAGNOSIS — R062 Wheezing: Secondary | ICD-10-CM

## 2018-05-05 DIAGNOSIS — J069 Acute upper respiratory infection, unspecified: Secondary | ICD-10-CM

## 2018-05-05 DIAGNOSIS — R059 Cough, unspecified: Secondary | ICD-10-CM

## 2018-05-05 MED ORDER — BENZONATATE 100 MG PO CAPS: 100.0000 mg | ORAL_CAPSULE | Freq: Three times a day (TID) | ORAL | 0 refills | Status: DC | PRN

## 2018-05-05 MED ORDER — ALBUTEROL SULFATE HFA 108 (90 BASE) MCG/ACT IN AERS: 2.0000 | INHALATION_SPRAY | RESPIRATORY_TRACT | 1 refills | Status: DC | PRN

## 2018-05-05 NOTE — Patient Instructions (Addendum)
  I need to get your flu shot sometime during the next week.  That can be done here or at a local pharmacy or clinic.  Drink lots of fluids and get plenty of rest  Use the albuterol inhaler 2 inhalations every 4-6 hours if needed for wheezing and coughing.  Take benzonatate cough pills 1 or 2 pills 3 times daily if cough  Take Mucinex DM cough syrup as needed for coughing  Take Claritin-D or Allegra-D or Zyrtec-D (loratadine D or fexofenadine D or cetirizine D) if needed for head congestion.  Tylenol 500 mg (acetaminophen) 2 pills 3 times daily for headache or fever or body aches.  There is no evidence for pneumonia at this time.  However with her history it is important that if she gets worse rather than better that she gets rechecked.     If you have lab work done today you will be contacted with your lab results within the next 2 weeks.  If you have not heard from [place] then please contact us. The fastest way to get your results is to register for My Chart.   IF you received an x-ray today, you will receive an invoice from [facility] Radiology. Please contact [facility] Radiology at [phone] with questions or concerns regarding your invoice.   IF you received labwork today, you will receive an invoice from [place]. Please contact [facility] at [phone] with questions or concerns regarding your invoice.   Our billing staff will not be able to assist you with questions regarding bills from these companies.  You will be contacted with the lab results as soon as they are available. The fastest way to get your results is to activate your My Chart account. Instructions are located on the last page of this paperwork. If you have not heard from [place] regarding the results in 2 weeks, please contact this office.

## 2018-05-05 NOTE — Progress Notes (Signed)
Patient ID: Jamie [name], female    DOB: 23-Feb-1966  Age: 52 y.o. MRN: [id number]  Chief Complaint  Patient presents with  . Cough    X 3 days  . Nasal Congestion    X 3 days    Subjective:   52 year old lady who comes in here with her daughter.  The patient does not speak much English but the daughter is fluent.  For about 3 days the patient has been sick with nasal congestion, cough.  It started as a slight cough.  She does not smoke.  She has not had a flu shot.  She is coughing up some clear phlegm.  She gets some wheezing and sounds in her throat.  She has not been running a documented fever but she feels sweaty.  She also has a history of having had pneumonia several times in the past.  She is not employed.  Current allergies, medications, problem list, past/family and social histories reviewed.  Objective:  BP 120/80   Pulse 86   Temp 98.6 F (37 C) (Oral)   Resp 20   Ht 5' 3.15" (1.604 m)   Wt 176 lb 12.8 oz (80.2 kg)   LMP 01/04/2018   SpO2 98%   BMI 31.17 kg/m   Pleasant lady, alert and oriented.  Sniffling a lot.  Some coughing which is tight sounding.  Her TMs are normal.  Throat not erythematous.  Neck supple without significant nodes.  She has had some tenderness below her years but I do not feel any nodes in those areas.  Her chest had a little wheeze on the right.  Heart regular without any murmurs.  As noted she is coughing.  Skin is a little clammy feeling.  Assessment & Plan:   Assessment: 1. Cough   2. Wheeze   3. Viral upper respiratory infection       Plan: Instructions  No orders of the defined types were placed in this encounter.   Meds ordered this encounter  Medications  . albuterol (PROVENTIL HFA;VENTOLIN HFA) 108 (90 Base) MCG/ACT inhaler    Sig: Inhale 2 puffs into the lungs every 4 (four) hours as needed for wheezing or shortness of breath (cough, shortness of breath or wheezing.).    Dispense:  1 Inhaler    Refill:  1  .  benzonatate (TESSALON) 100 MG capsule    Sig: Take 1-2 capsules (100-200 mg total) by mouth 3 (three) times daily as needed.    Dispense:  30 capsule    Refill:  0         Patient Instructions    I need to get your flu shot sometime during the next week.  That can be done here or at a local pharmacy or clinic.  Drink lots of fluids and get plenty of rest  Use the albuterol inhaler 2 inhalations every 4-6 hours if needed for wheezing and coughing.  Take benzonatate cough pills 1 or 2 pills 3 times daily if cough  Take Mucinex DM cough syrup as needed for coughing  Take Claritin-D or Allegra-D or Zyrtec-D (loratadine D or fexofenadine D or cetirizine D) if needed for head congestion.  Tylenol 500 mg (acetaminophen) 2 pills 3 times daily for headache or fever or body aches.  There is no evidence for pneumonia at this time.  However with her history it is important that if she gets worse rather than better that she gets rechecked.     If you have lab work  done today you will be contacted with your lab results within the next 2 weeks.  If you have not heard from [place] then please contact us. The fastest way to get your results is to register for My Chart.   IF you received an x-ray today, you will receive an invoice from [facility] Radiology. Please contact [facility] Radiology at [phone] with questions or concerns regarding your invoice.   IF you received labwork today, you will receive an invoice from [place]. Please contact [facility] at [phone] with questions or concerns regarding your invoice.   Our billing staff will not be able to assist you with questions regarding bills from these companies.  You will be contacted with the lab results as soon as they are available. The fastest way to get your results is to activate your My Chart account. Instructions are located on the last page of this paperwork. If you have not heard from [place] regarding the results in 2 weeks,  please contact this office.        No follow-ups on file.   Ruben Reason, MD 05/05/2018

## 2018-05-31 ENCOUNTER — Other Ambulatory Visit: Payer: Self-pay

## 2018-05-31 ENCOUNTER — Encounter: Payer: Self-pay | Admitting: Family Medicine

## 2018-05-31 ENCOUNTER — Ambulatory Visit (INDEPENDENT_AMBULATORY_CARE_PROVIDER_SITE_OTHER): Payer: Self-pay

## 2018-05-31 ENCOUNTER — Ambulatory Visit: Payer: Self-pay | Admitting: Family Medicine

## 2018-05-31 VITALS — BP 135/78 | HR 86 | Temp 98.7°F | Resp 18 | Ht 63.15 in | Wt 178.2 lb

## 2018-05-31 DIAGNOSIS — R05 Cough: Secondary | ICD-10-CM

## 2018-05-31 DIAGNOSIS — R059 Cough, unspecified: Secondary | ICD-10-CM

## 2018-05-31 DIAGNOSIS — Z23 Encounter for immunization: Secondary | ICD-10-CM

## 2018-05-31 DIAGNOSIS — R058 Other specified cough: Secondary | ICD-10-CM

## 2018-05-31 MED ORDER — HYDROCODONE-HOMATROPINE 5-1.5 MG/5ML PO SYRP: 5.0000 mL | ORAL_SOLUTION | ORAL | 0 refills | Status: DC | PRN

## 2018-05-31 MED ORDER — AZITHROMYCIN 250 MG PO TABS: ORAL_TABLET | ORAL | 0 refills | Status: DC

## 2018-05-31 MED ORDER — PREDNISONE 20 MG PO TABS: ORAL_TABLET | ORAL | 0 refills | Status: DC

## 2018-05-31 NOTE — Patient Instructions (Addendum)
Continue to try to drink plenty of fluids.  Take prednisone 20 mg 3 pills daily for 2 days, then 2 daily for 2 days, then 1 daily for 2 days, then one half daily.  Wait until tomorrow morning to start taking this, and take it every day in the morning.  Azithromycin 250 mg 2 pills initially, then 1 daily for 4 days  Continue to use the albuterol inhaler 2 inhalations every 4-6 hours as needed  Hycodan cough syrup 1 teaspoon every 4-6 hours for nighttime cough.  It is usually not good for her in the daytime because it makes you drowsy.    Take Tylenol 500 mg 2 pills 3 times daily (acetaminophen) and/or ibuprofen 200 mg 3 pills 3 times daily if needed when having spells of being hot and cold.  If you do not take it in the daytime you can take some Robitussin-DM or Mucinex DM.  Return if not improving.    If you have lab work done today you will be contacted with your lab results within the next 2 weeks.  If you have not heard from [place] then please contact us. The fastest way to get your results is to register for My Chart.   IF you received an x-ray today, you will receive an invoice from [facility] Radiology. Please contact [facility] Radiology at [phone] with questions or concerns regarding your invoice.   IF you received labwork today, you will receive an invoice from [place]. Please contact [facility] at [phone] with questions or concerns regarding your invoice.   Our billing staff will not be able to assist you with questions regarding bills from these companies.  You will be contacted with the lab results as soon as they are available. The fastest way to get your results is to activate your My Chart account. Instructions are located on the last page of this paperwork. If you have not heard from [place] regarding the results in 2 weeks, please contact this office.

## 2018-05-31 NOTE — Progress Notes (Signed)
Patient ID: Jamie [name], female    DOB: 01-Apr-1966  Age: 53 y.o. MRN: [id number]  Chief Complaint  Patient presents with  . Cough    x14month follow up     Subjective:   53 year old lady who was here a month ago with a cough.  I treated her at that time with albuterol and benzonatate.  She has continued to cough without improvement over the past month.  She does not smoke.  She has not been particularly ill.  She just has this constant cough.  She has had some chills but no documented fevers.  Has a little runny nose.  No ear pain or sore throat problems.  Current allergies, medications, problem list, past/family and social histories reviewed.  Objective:  BP 135/78   Pulse 86   Temp 98.7 F (37.1 C) (Oral)   Resp 18   Ht 5' 3.15" (1.604 m)   Wt 178 lb 3.2 oz (80.8 kg)   SpO2 96%   BMI 31.42 kg/m   No major acute distress.  Coughing and sniffling.  TMs normal.  Nose congested.  Sinuses nontender.  Throat clear.  Neck supple without significant nodes.  Chest fairly clear to auscultation today.  Heart regular without murmur.  Assessment & Plan:   Assessment: 1. Need for influenza vaccination   2. Post-viral cough syndrome   3. Cough       Plan: Probably a post viral cough syndrome.  Check a chest x-ray to make certain we are not missing anything.    Orders Placed This Encounter  Procedures  . DG Chest 2 View    Standing Status:   Future    Number of Occurrences:   1    Standing Expiration Date:   05/31/2019    Order Specific Question:   Reason for Exam (SYMPTOM  OR DIAGNOSIS REQUIRED)    Answer:   cough 1 month    Order Specific Question:   Is the patient pregnant?    Answer:   No    Order Specific Question:   Preferred imaging location?    Answer:   External  . Flu Vaccine QUAD 36+ mos IM    No orders of the defined types were placed in this encounter.        Patient Instructions   Continue to try to drink plenty of fluids.  Take prednisone 20  mg 3 pills daily for 2 days, then 2 daily for 2 days, then 1 daily for 2 days, then one half daily.  Azithromycin 250 mg 2 pills initially, then 1 daily for 4 days  Continue to use the albuterol inhaler 2 inhalations every 4-6 hours as needed  Hycodan cough syrup 1 teaspoon every 4-6 hours for nighttime cough.  It is usually not good for her in the daytime because it makes you drowsy.  If you do not take it in the daytime you can take some Robitussin-DM or Mucinex DM.  Return if not improving.    If you have lab work done today you will be contacted with your lab results within the next 2 weeks.  If you have not heard from [place] then please contact us. The fastest way to get your results is to register for My Chart.   IF you received an x-ray today, you will receive an invoice from [facility] Radiology. Please contact [facility] Radiology at [phone] with questions or concerns regarding your invoice.   IF you received labwork today, you will receive an invoice  from [place]. Please contact [facility] at [phone] with questions or concerns regarding your invoice.   Our billing staff will not be able to assist you with questions regarding bills from these companies.  You will be contacted with the lab results as soon as they are available. The fastest way to get your results is to activate your My Chart account. Instructions are located on the last page of this paperwork. If you have not heard from [place] regarding the results in 2 weeks, please contact this office.        No follow-ups on file.   Ruben Reason, MD 05/31/2018

## 2019-04-09 IMAGING — DX DG CHEST 2V
2 series · 2 of 2 positions shown · non-contrast
Comparison: None.

CLINICAL DATA: Cough for 1 month.

EXAM:
CHEST - 2 VIEW

[chest pa]
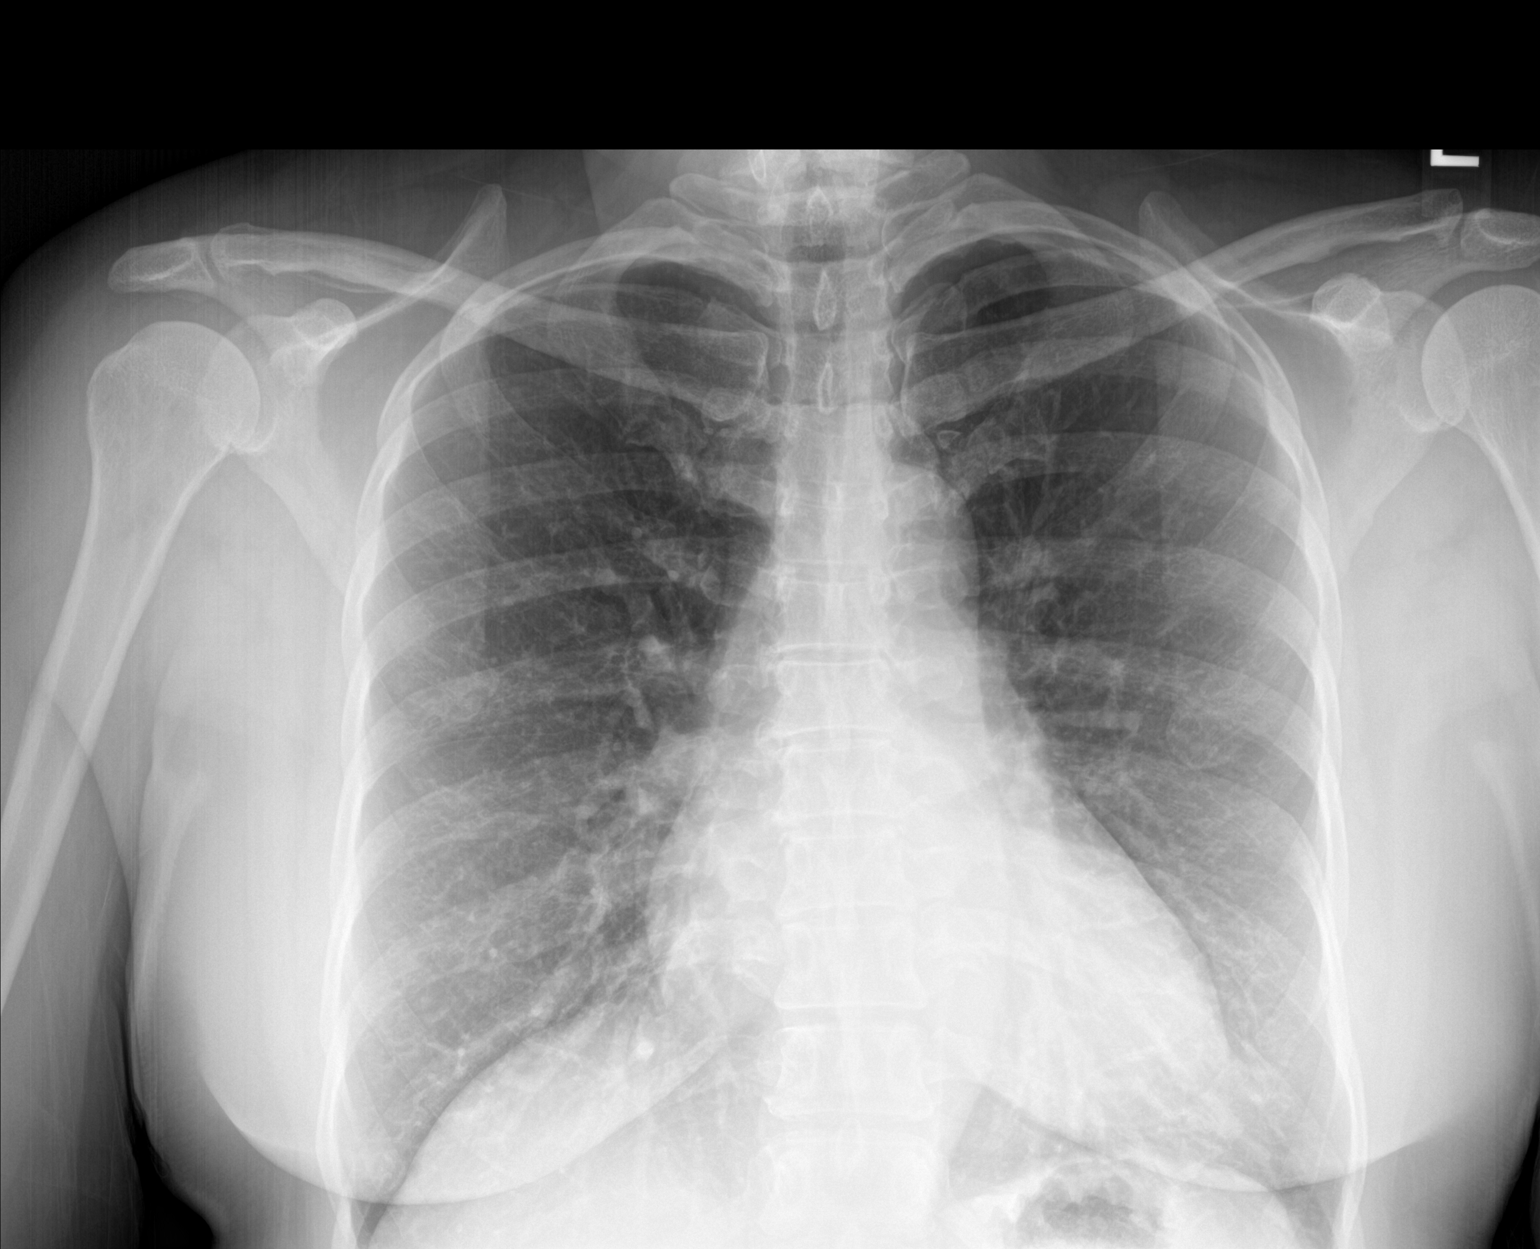

[chest lat]
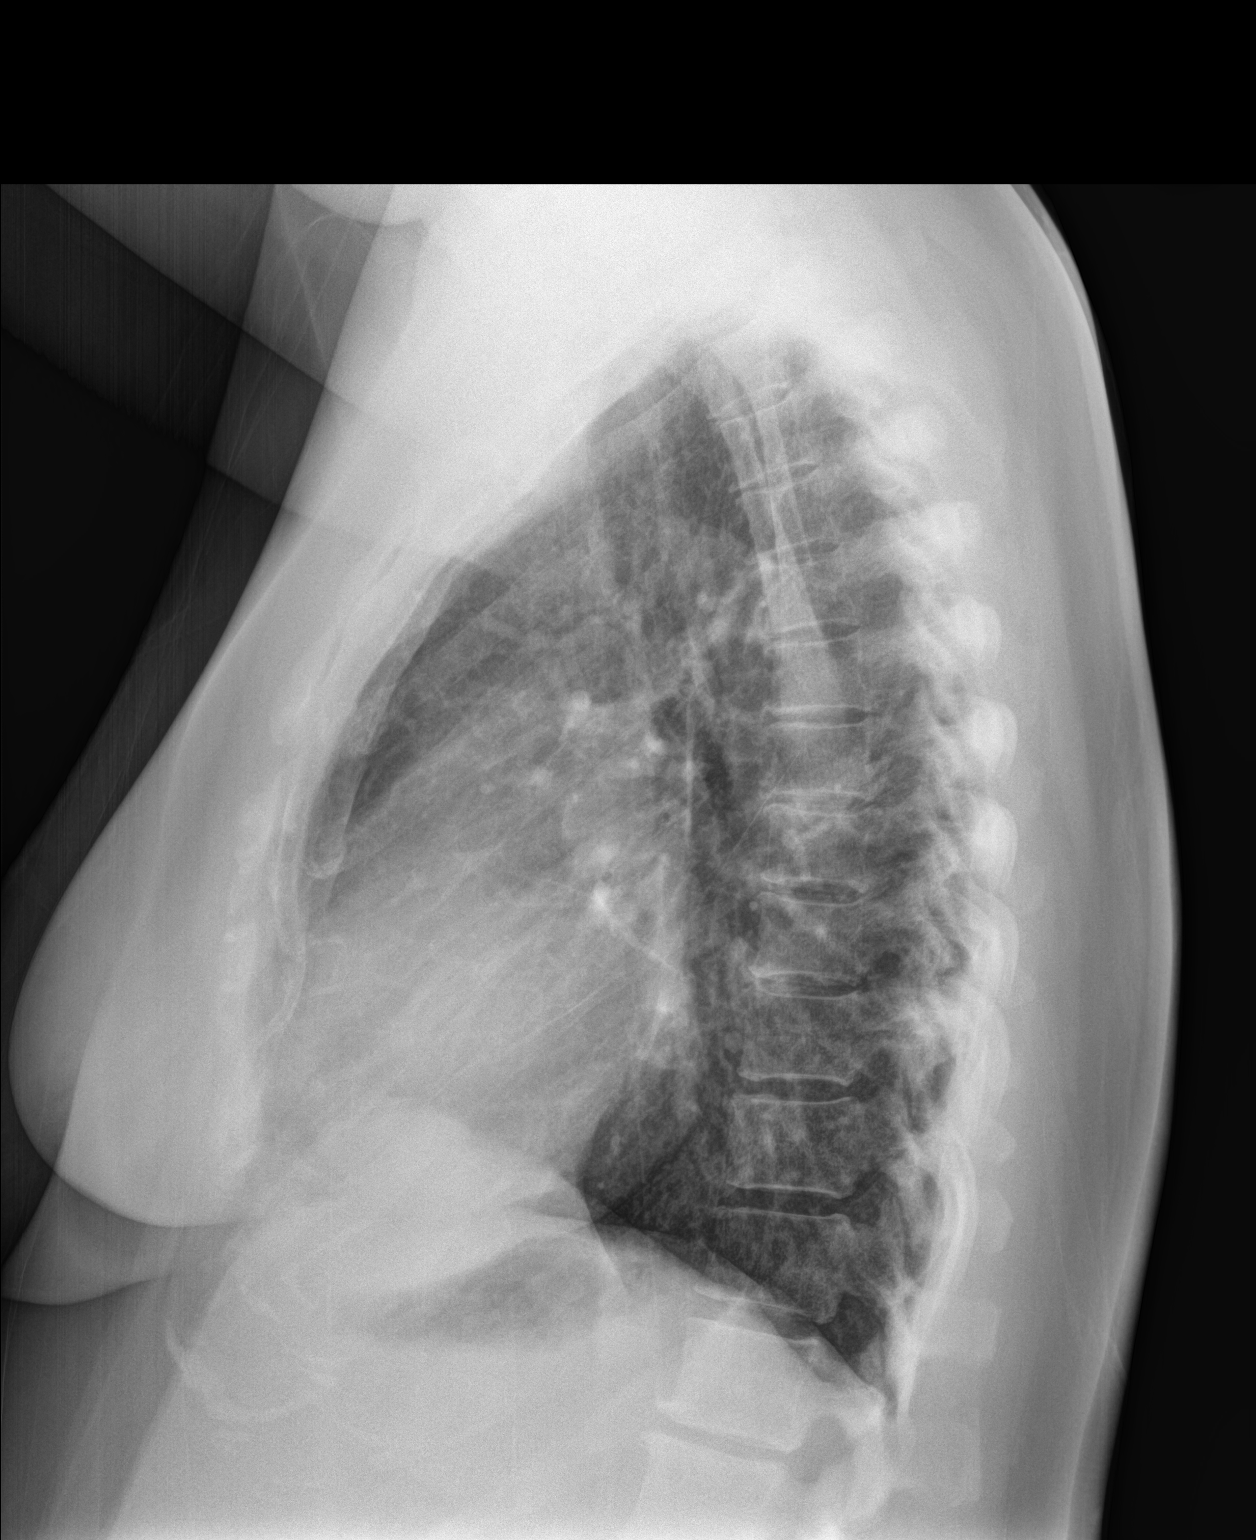

[2 of 2 positions shown; findings below may reference images not displayed]

FINDINGS: Midline trachea. Normal heart size and mediastinal contours. No
pleural effusion or pneumothorax. Clear lungs.
IMPRESSION: No active cardiopulmonary disease.

## 2021-09-06 ENCOUNTER — Other Ambulatory Visit: Payer: Self-pay

## 2021-09-06 ENCOUNTER — Encounter (HOSPITAL_COMMUNITY): Payer: Self-pay | Admitting: Pharmacy Technician

## 2021-09-06 ENCOUNTER — Emergency Department (HOSPITAL_COMMUNITY)
Admission: EM | Admit: 2021-09-06 | Discharge: 2021-09-06 | Disposition: A | Discharge status: Home / Self Care | Payer: Self-pay | Attending: Emergency Medicine | Admitting: Emergency Medicine

## 2021-09-06 DIAGNOSIS — R42 Dizziness and giddiness: Secondary | ICD-10-CM | POA: Insufficient documentation

## 2021-09-06 DIAGNOSIS — I10 Essential (primary) hypertension: Secondary | ICD-10-CM | POA: Insufficient documentation

## 2021-09-06 DIAGNOSIS — Z79899 Other long term (current) drug therapy: Secondary | ICD-10-CM | POA: Insufficient documentation

## 2021-09-06 DIAGNOSIS — R197 Diarrhea, unspecified: Secondary | ICD-10-CM | POA: Insufficient documentation

## 2021-09-06 DIAGNOSIS — R112 Nausea with vomiting, unspecified: Secondary | ICD-10-CM | POA: Insufficient documentation

## 2021-09-06 DIAGNOSIS — H9319 Tinnitus, unspecified ear: Secondary | ICD-10-CM | POA: Insufficient documentation

## 2021-09-06 LAB — URINALYSIS, ROUTINE W REFLEX MICROSCOPIC
Bilirubin Urine: NEGATIVE
Glucose, UA: NEGATIVE mg/dL
Hgb urine dipstick: NEGATIVE
Ketones, ur: 20 mg/dL — AB
Nitrite: NEGATIVE
Protein, ur: 100 mg/dL — AB
Specific Gravity, Urine: 1.018 (ref 1.005–1.030)
pH: 8 (ref 5.0–8.0)

## 2021-09-06 LAB — CBC
HCT: 36.1 % (ref 36.0–46.0)
Hemoglobin: 12.9 g/dL (ref 12.0–15.0)
MCH: 33.2 pg (ref 26.0–34.0)
MCHC: 35.7 g/dL (ref 30.0–36.0)
MCV: 92.8 fL (ref 80.0–100.0)
Platelets: 217 10*3/uL (ref 150–400)
RBC: 3.89 MIL/uL (ref 3.87–5.11)
RDW: 11.5 % (ref 11.5–15.5)
WBC: 8.4 10*3/uL (ref 4.0–10.5)
nRBC: 0 % (ref 0.0–0.2)

## 2021-09-06 LAB — BASIC METABOLIC PANEL
Anion gap: 9 (ref 5–15)
BUN: 18 mg/dL (ref 6–20)
CO2: 25 mmol/L (ref 22–32)
Calcium: 9.9 mg/dL (ref 8.9–10.3)
Chloride: 106 mmol/L (ref 98–111)
Creatinine, Ser: 0.78 mg/dL (ref 0.44–1.00)
GFR, Estimated: >60 mL/min (ref >60)
Glucose, Bld: 189 mg/dL — ABNORMAL HIGH (ref 70–99)
Potassium: 3.1 mmol/L — ABNORMAL LOW (ref 3.5–5.1)
Sodium: 140 mmol/L (ref 135–145)

## 2021-09-06 LAB — TSH: TSH: 0.454 u[IU]/mL (ref 0.350–4.500)

## 2021-09-06 MED ORDER — ONDANSETRON HCL 4 MG/2ML IJ SOLN
4.0000 mg | Freq: Once | INTRAMUSCULAR | Status: AC
  Administered 2021-09-06: 4 mg via INTRAVENOUS
  Filled 2021-09-06: qty 2

## 2021-09-06 MED ORDER — ONDANSETRON HCL 4 MG PO TABS: 4.0000 mg | ORAL_TABLET | Freq: Four times a day (QID) | ORAL | 0 refills | Status: DC

## 2021-09-06 MED ORDER — SODIUM CHLORIDE 0.9 % IV BOLUS
1000.0000 mL | Freq: Once | INTRAVENOUS | Status: AC
  Administered 2021-09-06: 1000 mL via INTRAVENOUS

## 2021-09-06 NOTE — ED Provider Notes (Signed)
?[place] ?Provider Note ? ? ?CSN: [id number] ?Arrival date & time: 09/06/21  1416 ? ?  ? ?History ? ?Chief Complaint  ?Patient presents with  ? Dizziness  ? ? ?Jamie [name] is a 56 y.o. female. ? ?The history is provided by the patient.  ?Dizziness ?Quality:  Lightheadedness ?Severity:  Mild ?Onset quality:  Gradual ?Duration:  1 hour ?Progression:  Resolved ?Chronicity:  New ?Context: standing up   ?Relieved by:  Nothing ?Worsened by:  Nothing ?Associated symptoms: diarrhea, nausea, tinnitus and vomiting   ?Associated symptoms: no blood in stool, no chest pain, no headaches, no hearing loss, no palpitations, no shortness of breath, no syncope, no vision changes and no weakness   ?Risk factors comment:  Thyroid disease, hypertension, high cholesterol ? ?  ? ?Home Medications ?Prior to Admission medications   ?Medication Sig Start Date End Date Taking? Authorizing Provider  ?ondansetron (ZOFRAN) 4 MG tablet Take 1 tablet (4 mg total) by mouth every 6 (six) hours. 09/06/21  Yes Cypress Fanfan, DO  ?albuterol (PROVENTIL HFA;VENTOLIN HFA) 108 (90 Base) MCG/ACT inhaler Inhale 2 puffs into the lungs every 4 (four) hours as needed for wheezing or shortness of breath (cough, shortness of breath or wheezing.). ?Patient not taking: Reported on 05/31/2018 05/05/18   Posey Boyer, MD  ?atorvastatin (LIPITOR) 40 MG tablet Take 1 tablet (40 mg total) by mouth daily. 12/02/17   Jacelyn Pi, Lilia Argue, MD  ?azithromycin (ZITHROMAX) 250 MG tablet Take 2 tabs PO x 1 dose, then 1 tab PO QD x 4 days 05/31/18   Posey Boyer, MD  ?cyclobenzaprine (FLEXERIL) 10 MG tablet Take 1 tablet (10 mg total) by mouth 3 (three) times daily as needed for muscle spasms. ?Patient not taking: Reported on 05/05/2018 11/20/17   Jacelyn Pi, Lilia Argue, MD  ?HYDROcodone-homatropine [facility]) 5-1.5 MG/5ML syrup Take 5 mLs by mouth every 4 (four) hours as needed. 05/31/18   Posey Boyer, MD  ?levothyroxine  (SYNTHROID, LEVOTHROID) 100 MCG tablet Take 1 tablet (100 mcg total) by mouth daily. 08/18/17   Daleen Squibb, MD  ?lisinopril-hydrochlorothiazide (ZESTORETIC) 20-12.5 MG tablet Take 1 tablet by mouth daily. 09/30/17   Jacelyn Pi, Lilia Argue, MD  ?predniSONE (DELTASONE) 20 MG tablet Take 3 daily for 2 days, then 2 daily for 2 days, then 1 daily for 2 days, then one half daily for 4 days 05/31/18   Posey Boyer, MD  ?   ? ?Allergies    ?Patient has no known allergies.   ? ?Review of Systems   ?Review of Systems  ?HENT:  Positive for tinnitus. Negative for hearing loss.   ?Respiratory:  Negative for shortness of breath.   ?Cardiovascular:  Negative for chest pain, palpitations and syncope.  ?Gastrointestinal:  Positive for diarrhea, nausea and vomiting. Negative for blood in stool.  ?Neurological:  Positive for dizziness. Negative for weakness and headaches.  ? ?Physical Exam ?Updated Vital Signs ?BP (!) 154/67   Pulse (!) 57   Temp 98 ?F (36.7 ?C)   Resp 16   SpO2 100%  ?Physical Exam ?Vitals and nursing note reviewed.  ?Constitutional:   ?   General: She is not in acute distress. ?   Appearance: She is well-developed. She is not ill-appearing.  ?HENT:  ?   Head: Normocephalic and atraumatic.  ?Eyes:  ?   Extraocular Movements: Extraocular movements intact.  ?   Conjunctiva/sclera: Conjunctivae normal.  ?   Pupils: Pupils are  equal, round, and reactive to light.  ?Cardiovascular:  ?   Rate and Rhythm: Normal rate and regular rhythm.  ?   Pulses: Normal pulses.  ?   Heart sounds: Normal heart sounds. No murmur heard. ?Pulmonary:  ?   Effort: Pulmonary effort is normal. No respiratory distress.  ?   Breath sounds: Normal breath sounds.  ?Abdominal:  ?   Palpations: Abdomen is soft.  ?   Tenderness: There is no abdominal tenderness.  ?Musculoskeletal:     ?   General: No swelling.  ?   Cervical back: Neck supple.  ?Skin: ?   General: Skin is warm and dry.  ?   Capillary Refill: Capillary refill takes less than  2 seconds.  ?Neurological:  ?   General: No focal deficit present.  ?   Mental Status: She is alert and oriented to person, place, and time.  ?   Cranial Nerves: No cranial nerve deficit.  ?   Sensory: No sensory deficit.  ?   Motor: No weakness.  ?   Coordination: Coordination normal.  ?   Comments: 5+ out of 5 strength throughout, normal sensation, no drift, normal finger-nose-finger, normal speech  ?Psychiatric:     ?   Mood and Affect: Mood normal.  ? ? ?ED Results / Procedures / Treatments   ?Labs ?(all labs ordered are listed, but only abnormal results are displayed) ?Labs Reviewed  ?BASIC METABOLIC PANEL - Abnormal; Notable for the following components:  ?    Result Value  ? Potassium 3.1 (*)   ? Glucose, Bld 189 (*)   ? All other components within normal limits  ?URINALYSIS, ROUTINE W REFLEX MICROSCOPIC - Abnormal; Notable for the following components:  ? APPearance HAZY (*)   ? Ketones, ur 20 (*)   ? Protein, ur 100 (*)   ? Leukocytes,Ua SMALL (*)   ? Bacteria, UA FEW (*)   ? All other components within normal limits  ?CBC  ?TSH  ? ? ?EKG ?EKG Interpretation ? ?Date/Time:  Friday September 06 2021 14:35:11 EDT ?Ventricular Rate:  59 ?PR Interval:  194 ?QRS Duration: 78 ?QT Interval:  452 ?QTC Calculation: 447 ?R Axis:   74 ?Text Interpretation: Sinus bradycardia No previous ECGs available Confirmed by Lennice Sites [id number]) on 09/06/2021 5:42:22 PM ? ?Radiology ?No results found. ? ?Procedures ?Procedures  ? ? ?Medications Ordered in ED ?Medications  ?sodium chloride 0.9 % bolus 1,000 mL (1,000 mLs Intravenous New Bag/Given 09/06/21 1842)  ?ondansetron (ZOFRAN) injection 4 mg (4 mg Intravenous Given 09/06/21 1842)  ? ? ?ED Course/ Medical Decision Making/ A&P ?  ?                        ?Medical Decision Making ?Risk ?Prescription drug management. ? ? ?Jamie [name] is here after episode of near syncope.  Patient with high blood pressure but otherwise unremarkable vitals.  EKG per my review and  interpretation shows sinus rhythm.  No ischemic changes.  History of high cholesterol, hypertension.  Denies any chest pain or shortness of breath.  Patient was standing doing dishes when she got lightheaded got nauseous and threw up.  Did not pass out.  Has had some diarrhea.  No known sick contacts or suspicious food intake.  She did not hit her head or lose consciousness.  Denies any headache or numbness or weakness.  Neurologically she is intact with normal exam.  She overall states symptoms have resolved.  She  is not having any abdominal pain, no abdominal tenderness on exam.  Differential diagnosis is likely vasovagal event versus viral gastroenteritis versus dehydration versus electrolyte abnormality.  She has not had any melena or hematochezia.  Will check CBC, BMP, ?Thyroid study.  Denies any other fracture symptoms.  No chest pain or shortness of breath.  Will give IV fluids.  Will give IV Zofran. ? ?Per review and interpretation of her labs she has no significant anemia, electrolyte abnormality or kidney injury.  No significant leukocytosis.  Thyroid studies normal.  Patient feeling better after IV fluids and IV Zofran.  I have no concern for ACS or PE or stroke.  Suspect that this was a vasovagal process.  May be the start of a GI illness given nausea, vomiting, diarrhea.  Will prescribe Zofran.  Discharged in good condition.  Understands return precautions. ? ?This chart was dictated using voice recognition software.  Despite best efforts to proofread,  errors can occur which can change the documentation meaning.  ? ? ? ? ? ? ? ?Final Clinical Impression(s) / ED Diagnoses ?Final diagnoses:  ?Lightheadedness  ? ? ?Rx / DC Orders ?ED Discharge Orders   ? ?      Ordered  ?  ondansetron (ZOFRAN) 4 MG tablet  Every 6 hours       ? 09/06/21 2044  ? ?  ?  ? ?  ? ? ?  ?Lennice Sites, DO ?09/06/21 2044 ? ?

## 2021-09-06 NOTE — ED Provider Triage Note (Signed)
Emergency Medicine Provider Triage Evaluation Note ? ?Jamie [name] , a 56 y.o. female  was evaluated in triage.  Pt complains of dizziness upon standing, flushing, and emesis x2 hours.  ? ?Review of Systems  ?Positive: Headache, dizziness, vomiting ?Negative: Chest pain, shortness of breath, syncope ? ?Physical Exam  ?BP (!) 179/75 (BP Location: Right Arm)   Pulse (!) 55   Temp 98 ?F (36.7 ?C)   Resp 17   SpO2 98%  ?Gen:   Awake, no distress   ?Resp:  Normal effort  ?MSK:   Moves extremities without difficulty  ?Other:   ? ?Medical Decision Making  ?Medically screening exam initiated at 2:38 PM.  Appropriate orders placed.  Jamie [name] was informed that the remainder of the evaluation will be completed by another provider, this initial triage assessment does not replace that evaluation, and the importance of remaining in the ED until their evaluation is complete. ? ? ?  ?Kateri Plummer, PA-C ?09/06/21 1439 ? ?

## 2021-09-06 NOTE — ED Triage Notes (Signed)
Pt here with reports of dizziness upon standing, feeling hot in the face and emesis X2 hours.  ?

## 2021-12-30 ENCOUNTER — Other Ambulatory Visit: Payer: Self-pay | Admitting: Obstetrics and Gynecology

## 2021-12-30 DIAGNOSIS — Z1231 Encounter for screening mammogram for malignant neoplasm of breast: Secondary | ICD-10-CM

## 2022-02-13 ENCOUNTER — Ambulatory Visit: Payer: Self-pay | Admitting: Hematology and Oncology

## 2022-02-13 ENCOUNTER — Ambulatory Visit
Admission: RE | Admit: 2022-02-13 | Discharge: 2022-02-13 | Disposition: A | Discharge status: Home / Self Care | Payer: No Typology Code available for payment source | Source: Ambulatory Visit | Attending: Nurse Practitioner | Admitting: Nurse Practitioner

## 2022-02-13 VITALS — BP 150/80 | Wt 167.3 lb

## 2022-02-13 DIAGNOSIS — Z1231 Encounter for screening mammogram for malignant neoplasm of breast: Secondary | ICD-10-CM

## 2022-02-13 DIAGNOSIS — Z1211 Encounter for screening for malignant neoplasm of colon: Secondary | ICD-10-CM

## 2022-02-13 DIAGNOSIS — Z01419 Encounter for gynecological examination (general) (routine) without abnormal findings: Secondary | ICD-10-CM

## 2022-02-13 NOTE — Progress Notes (Addendum)
Ms. Cleotha Whalin is a 56 y.o. [id number] female who presents to [facility] clinic today with no complaints .    Pap Smear: Pap smear completed today. Last Pap smear was 03/08/2018 at [facility] screening clinic and was normal. Per patient has no history of an abnormal Pap smear. Last Pap smear result is available in Epic.   Physical exam: Breasts Breasts symmetrical. No skin abnormalities bilateral breasts. No nipple retraction bilateral breasts. No nipple discharge bilateral breasts. No lymphadenopathy. No lumps palpated bilateral breasts.         Pelvic/Bimanual Ext Genitalia No lesions, no swelling and no discharge observed on external genitalia.        Vagina Vagina pink and normal texture. No lesions or discharge observed in vagina.        Cervix Cervix is present. Cervix pink and of normal texture. No discharge observed.    Uterus Uterus is present and palpable. Uterus in normal position and normal size.        Adnexae Bilateral ovaries present and palpable. No tenderness on palpation.         Rectovaginal No rectal exam completed today since patient had no rectal complaints. No skin abnormalities observed on exam.     Smoking History: Patient has never smoked and was not referred to quit line.    Patient Navigation: Patient education provided. Access to services provided for patient through [place] interpreter provided. No transportation provided   Colorectal Cancer Screening: Per patient has never had colonoscopy completed No complaints today. FIT test given.   Breast and Cervical Cancer Risk Assessment: Patient does not have family history of breast cancer, known genetic mutations, or radiation treatment to the chest before age 20. Patient does not have history of cervical dysplasia, immunocompromised, or DES exposure in-utero.  Risk Assessment     Risk Scores       02/13/2022   Last edited by: Royston Bake, CMA   5-year risk: 0.7 %    Lifetime risk: 5 %            A: BCCCP exam with pap smear No complaints with benign exam.   P: Referred patient to the [facility] for a screening mammogram. Appointment scheduled 02/13/2022.  Melodye Ped, NP 02/13/2022 2:43 PM

## 2022-02-14 LAB — CYTOLOGY - PAP
Comment: NEGATIVE
Diagnosis: NEGATIVE
High risk HPV: NEGATIVE

## 2022-02-17 ENCOUNTER — Telehealth: Payer: Self-pay

## 2022-02-17 NOTE — Telephone Encounter (Signed)
Called patient via Jamie [name], [employer] to give pap smear results. Informed patient that pap smear was normal and HPV was negative. Based on this result her next pap smear will be due in 3 years. Patient voiced understanding.

## 2022-03-05 ENCOUNTER — Ambulatory Visit: Payer: No Typology Code available for payment source

## 2023-05-15 ENCOUNTER — Other Ambulatory Visit: Payer: Self-pay | Admitting: Obstetrics and Gynecology

## 2023-05-15 DIAGNOSIS — Z1231 Encounter for screening mammogram for malignant neoplasm of breast: Secondary | ICD-10-CM

## 2023-06-16 ENCOUNTER — Ambulatory Visit
Admission: RE | Admit: 2023-06-16 | Discharge: 2023-06-16 | Disposition: A | Discharge status: Home / Self Care | Payer: No Typology Code available for payment source | Source: Ambulatory Visit | Attending: Obstetrics and Gynecology | Admitting: Obstetrics and Gynecology

## 2023-06-16 ENCOUNTER — Ambulatory Visit: Payer: Self-pay | Admitting: *Deleted

## 2023-06-16 ENCOUNTER — Other Ambulatory Visit: Payer: Self-pay

## 2023-06-16 VITALS — BP 144/66 | Wt 168.0 lb

## 2023-06-16 DIAGNOSIS — Z1211 Encounter for screening for malignant neoplasm of colon: Secondary | ICD-10-CM

## 2023-06-16 DIAGNOSIS — Z1231 Encounter for screening mammogram for malignant neoplasm of breast: Secondary | ICD-10-CM

## 2023-06-16 DIAGNOSIS — Z1239 Encounter for other screening for malignant neoplasm of breast: Secondary | ICD-10-CM

## 2023-06-16 NOTE — Patient Instructions (Signed)
Explained breast self awareness with Jamie [name]. Patient did not need a Pap smear today due to last Pap smear and HPV Typing was 02/13/2022. Let her know BCCCP will cover Pap smears and HPV Typing every 5 years unless has a history of abnormal Pap smears. Referred patient to the [facility] for a screening mammogram on mobile unit. Appointment scheduled Tuesday, June 16, 2023 at 1530. Patient aware of appointment and will be there. Let patient know the Breast Center will follow up with her within the next couple weeks with results of mammogram by letter or phone. Jamie [name] verbalized understanding.  Irmgard Rampersaud, Kathaleen Maser, RN 3:21 PM

## 2023-06-16 NOTE — Progress Notes (Signed)
[name] is a 58 y.o. female who presents to [facility] clinic today with no complaints.    Pap Smear: Pap smear not completed today. Last Pap smear was 02/13/2022 at [facility] clinic and was normal with negative HPV. Per patient has no history of an abnormal Pap smear. Last Pap smear result is available in Epic.   Physical exam: Breasts Breasts symmetrical. Bilateral nipples discolored that is greater left nipple that patient stated has been prior to last mammogram. Bilateral nipple inversion observed on exam that is greater left nipple that per patient has been since prior to last mammogram with no changes. No nipple discharge bilateral breasts. No lymphadenopathy. No lumps palpated bilateral breasts. No complaints of pain or tenderness on exam.     MS DIGITAL SCREENING TOMO BILATERAL Result Date: 02/14/2022 CLINICAL DATA:  Screening. EXAM: DIGITAL SCREENING BILATERAL MAMMOGRAM WITH TOMOSYNTHESIS AND CAD TECHNIQUE: Bilateral screening digital craniocaudal and mediolateral oblique mammograms were obtained. Bilateral screening digital breast tomosynthesis was performed. The images were evaluated with computer-aided detection. COMPARISON:  Previous exam(s). ACR Breast Density Category c: The breast tissue is heterogeneously dense, which may obscure small masses. FINDINGS: There are no findings suspicious for malignancy. IMPRESSION: No mammographic evidence of malignancy. A result letter of this screening mammogram will be mailed directly to the patient. RECOMMENDATION: Screening mammogram in one year. (Code:SM-B-01Y) BI-RADS CATEGORY  1: Negative. Electronically Signed   By: Ted Mcalpine M.D.   On: 02/14/2022 14:41    Pelvic/Bimanual Pap is not indicated today per BCCCP guidelines.   Smoking History: Patient has never smoked.   Patient Navigation: Patient education provided. Access to services provided for patient through [place] program. Spanish interpreter Natale Lay from [facility]  provided.   Colorectal Cancer Screening: Per patient has never had colonoscopy completed. FIT Test given to patient to complete. No complaints today.    Breast and Cervical Cancer Risk Assessment: Patient does not have family history of breast cancer, known genetic mutations, or radiation treatment to the chest before age 77. Patient does not have history of cervical dysplasia, immunocompromised, or DES exposure in-utero.  Risk Scores as of Encounter on 06/16/2023     Jamie [name]           5-year 0.88%   Lifetime 5.65%            Last calculated by Caprice Red, CMA on 06/16/2023 at  3:05 PM        A: BCCCP exam without pap smear No complaints.  P: Referred patient to the [facility] for a screening mammogram on mobile unit. Appointment scheduled Tuesday, June 16, 2023 at 1530.  Priscille Heidelberg, RN 06/16/2023 3:21 PM

## 2023-07-05 LAB — FECAL OCCULT BLOOD, IMMUNOCHEMICAL: Fecal Occult Bld: NEGATIVE

## 2023-12-03 ENCOUNTER — Other Ambulatory Visit: Payer: Self-pay | Admitting: Orthopedic Surgery

## 2023-12-22 ENCOUNTER — Other Ambulatory Visit: Payer: Self-pay

## 2023-12-22 ENCOUNTER — Encounter (HOSPITAL_BASED_OUTPATIENT_CLINIC_OR_DEPARTMENT_OTHER): Payer: Self-pay | Admitting: Orthopedic Surgery

## 2023-12-23 ENCOUNTER — Encounter (HOSPITAL_BASED_OUTPATIENT_CLINIC_OR_DEPARTMENT_OTHER)
Admission: RE | Admit: 2023-12-23 | Discharge: 2023-12-23 | Disposition: A | Discharge status: Home / Self Care | Source: Ambulatory Visit | Attending: Orthopedic Surgery | Admitting: Orthopedic Surgery

## 2023-12-23 DIAGNOSIS — Z01818 Encounter for other preprocedural examination: Secondary | ICD-10-CM | POA: Insufficient documentation

## 2023-12-23 DIAGNOSIS — I10 Essential (primary) hypertension: Secondary | ICD-10-CM | POA: Insufficient documentation

## 2023-12-23 LAB — BASIC METABOLIC PANEL WITH GFR
Anion gap: 11 (ref 5–15)
BUN: 18 mg/dL (ref 6–20)
CO2: 24 mmol/L (ref 22–32)
Calcium: 9.4 mg/dL (ref 8.9–10.3)
Chloride: 104 mmol/L (ref 98–111)
Creatinine, Ser: 0.74 mg/dL (ref 0.44–1.00)
GFR, Estimated: >60 mL/min (ref >60)
Glucose, Bld: 93 mg/dL (ref 70–99)
Potassium: 3.8 mmol/L (ref 3.5–5.1)
Sodium: 139 mmol/L (ref 135–145)

## 2023-12-23 NOTE — Progress Notes (Signed)

## 2023-12-28 ENCOUNTER — Ambulatory Visit (HOSPITAL_BASED_OUTPATIENT_CLINIC_OR_DEPARTMENT_OTHER)
Admission: RE | Admit: 2023-12-28 | Discharge: 2023-12-28 | Disposition: A | Discharge status: Home / Self Care | Attending: Orthopedic Surgery | Admitting: Orthopedic Surgery

## 2023-12-28 ENCOUNTER — Other Ambulatory Visit: Payer: Self-pay

## 2023-12-28 ENCOUNTER — Encounter (HOSPITAL_BASED_OUTPATIENT_CLINIC_OR_DEPARTMENT_OTHER): Payer: Self-pay | Admitting: Orthopedic Surgery

## 2023-12-28 ENCOUNTER — Ambulatory Visit (HOSPITAL_BASED_OUTPATIENT_CLINIC_OR_DEPARTMENT_OTHER): Admitting: Anesthesiology

## 2023-12-28 ENCOUNTER — Encounter (HOSPITAL_BASED_OUTPATIENT_CLINIC_OR_DEPARTMENT_OTHER)
Admission: RE | Disposition: A | Discharge status: Home / Self Care | Payer: Self-pay | Source: Home / Self Care | Attending: Orthopedic Surgery

## 2023-12-28 DIAGNOSIS — E039 Hypothyroidism, unspecified: Secondary | ICD-10-CM | POA: Insufficient documentation

## 2023-12-28 DIAGNOSIS — I10 Essential (primary) hypertension: Secondary | ICD-10-CM | POA: Insufficient documentation

## 2023-12-28 DIAGNOSIS — Z79899 Other long term (current) drug therapy: Secondary | ICD-10-CM | POA: Insufficient documentation

## 2023-12-28 DIAGNOSIS — E119 Type 2 diabetes mellitus without complications: Secondary | ICD-10-CM | POA: Insufficient documentation

## 2023-12-28 DIAGNOSIS — M65341 Trigger finger, right ring finger: Secondary | ICD-10-CM | POA: Insufficient documentation

## 2023-12-28 DIAGNOSIS — Z833 Family history of diabetes mellitus: Secondary | ICD-10-CM | POA: Insufficient documentation

## 2023-12-28 DIAGNOSIS — Z7984 Long term (current) use of oral hypoglycemic drugs: Secondary | ICD-10-CM | POA: Insufficient documentation

## 2023-12-28 DIAGNOSIS — Z8249 Family history of ischemic heart disease and other diseases of the circulatory system: Secondary | ICD-10-CM | POA: Insufficient documentation

## 2023-12-28 DIAGNOSIS — K219 Gastro-esophageal reflux disease without esophagitis: Secondary | ICD-10-CM | POA: Insufficient documentation

## 2023-12-28 HISTORY — PX: TRIGGER FINGER RELEASE: SHX641

## 2023-12-28 HISTORY — DX: Type 2 diabetes mellitus without complications: E11.9

## 2023-12-28 HISTORY — DX: Hypothyroidism, unspecified: E03.9

## 2023-12-28 LAB — GLUCOSE, CAPILLARY
Glucose-Capillary: 119 mg/dL — ABNORMAL HIGH (ref 70–99)
Glucose-Capillary: 116 mg/dL — ABNORMAL HIGH (ref 70–99)

## 2023-12-28 SURGERY — RELEASE, A1 PULLEY, FOR TRIGGER FINGER
Anesthesia: General | Site: Ring Finger | Laterality: Right

## 2023-12-28 MED ORDER — BUPIVACAINE HCL (PF) 0.25 % IJ SOLN
INTRAMUSCULAR | Status: AC
  Filled 2023-12-28: qty 30

## 2023-12-28 MED ORDER — DEXAMETHASONE SODIUM PHOSPHATE 10 MG/ML IJ SOLN
INTRAMUSCULAR | Status: AC
  Filled 2023-12-28: qty 1

## 2023-12-28 MED ORDER — FENTANYL CITRATE (PF) 100 MCG/2ML IJ SOLN: 25.0000 ug | INTRAMUSCULAR | Status: DC | PRN

## 2023-12-28 MED ORDER — OXYCODONE HCL 5 MG PO TABS: 5.0000 mg | ORAL_TABLET | Freq: Once | ORAL | Status: DC | PRN

## 2023-12-28 MED ORDER — CEFAZOLIN SODIUM-DEXTROSE 2-4 GM/100ML-% IV SOLN
INTRAVENOUS | Status: AC
  Filled 2023-12-28: qty 100

## 2023-12-28 MED ORDER — HYDROCODONE-ACETAMINOPHEN 5-325 MG PO TABS: 1.0000 | ORAL_TABLET | Freq: Four times a day (QID) | ORAL | 0 refills | Status: DC | PRN

## 2023-12-28 MED ORDER — BUPIVACAINE HCL (PF) 0.25 % IJ SOLN
INTRAMUSCULAR | Status: DC | PRN
  Administered 2023-12-28: 9 mL

## 2023-12-28 MED ORDER — MIDAZOLAM HCL 2 MG/2ML IJ SOLN
INTRAMUSCULAR | Status: AC
  Filled 2023-12-28: qty 2

## 2023-12-28 MED ORDER — DROPERIDOL 2.5 MG/ML IJ SOLN: 0.6250 mg | Freq: Once | INTRAMUSCULAR | Status: DC | PRN

## 2023-12-28 MED ORDER — DEXAMETHASONE SODIUM PHOSPHATE 10 MG/ML IJ SOLN
INTRAMUSCULAR | Status: DC | PRN
  Administered 2023-12-28: 4 mg via INTRAVENOUS

## 2023-12-28 MED ORDER — FENTANYL CITRATE (PF) 100 MCG/2ML IJ SOLN
INTRAMUSCULAR | Status: AC
Start: 2023-12-28 — End: 2023-12-28
  Filled 2023-12-28: qty 2

## 2023-12-28 MED ORDER — OXYCODONE HCL 5 MG/5ML PO SOLN: 5.0000 mg | Freq: Once | ORAL | Status: DC | PRN

## 2023-12-28 MED ORDER — ACETAMINOPHEN 500 MG PO TABS
1000.0000 mg | ORAL_TABLET | Freq: Once | ORAL | Status: AC
  Administered 2023-12-28: 1000 mg via ORAL

## 2023-12-28 MED ORDER — MIDAZOLAM HCL 5 MG/5ML IJ SOLN
INTRAMUSCULAR | Status: DC | PRN
  Administered 2023-12-28: 2 mg via INTRAVENOUS

## 2023-12-28 MED ORDER — ONDANSETRON HCL 4 MG/2ML IJ SOLN
INTRAMUSCULAR | Status: AC
  Filled 2023-12-28: qty 2

## 2023-12-28 MED ORDER — HYDROCODONE-ACETAMINOPHEN 5-325 MG PO TABS: 1.0000 | ORAL_TABLET | Freq: Four times a day (QID) | ORAL | 0 refills | Status: AC | PRN

## 2023-12-28 MED ORDER — LIDOCAINE 2% (20 MG/ML) 5 ML SYRINGE
INTRAMUSCULAR | Status: DC | PRN
Start: 2023-12-28 — End: 2023-12-28
  Administered 2023-12-28: 100 mg via INTRAVENOUS

## 2023-12-28 MED ORDER — CEFAZOLIN SODIUM-DEXTROSE 2-4 GM/100ML-% IV SOLN
2.0000 g | INTRAVENOUS | Status: AC
  Administered 2023-12-28: 2 g via INTRAVENOUS

## 2023-12-28 MED ORDER — LACTATED RINGERS IV SOLN: INTRAVENOUS | Status: DC

## 2023-12-28 MED ORDER — PROPOFOL 10 MG/ML IV BOLUS
INTRAVENOUS | Status: AC
  Filled 2023-12-28: qty 20

## 2023-12-28 MED ORDER — 0.9 % SODIUM CHLORIDE (POUR BTL) OPTIME
TOPICAL | Status: DC | PRN
  Administered 2023-12-28: 100 mL

## 2023-12-28 MED ORDER — ACETAMINOPHEN 500 MG PO TABS
ORAL_TABLET | ORAL | Status: AC
  Filled 2023-12-28: qty 2

## 2023-12-28 MED ORDER — FENTANYL CITRATE (PF) 100 MCG/2ML IJ SOLN
INTRAMUSCULAR | Status: DC | PRN
  Administered 2023-12-28: 50 ug via INTRAVENOUS

## 2023-12-28 MED ORDER — ONDANSETRON HCL 4 MG/2ML IJ SOLN
INTRAMUSCULAR | Status: DC | PRN
  Administered 2023-12-28: 4 mg via INTRAVENOUS

## 2023-12-28 MED ORDER — PROPOFOL 10 MG/ML IV BOLUS
INTRAVENOUS | Status: DC | PRN
  Administered 2023-12-28: 150 mg via INTRAVENOUS
  Administered 2023-12-28: 30 mg via INTRAVENOUS
  Administered 2023-12-28: 50 mg via INTRAVENOUS

## 2023-12-28 SURGICAL SUPPLY — 26 items
BLADE SURG 15 STRL LF DISP TIS (BLADE) ×2 IMPLANT
BNDG COHESIVE 2X5 TAN ST LF (GAUZE/BANDAGES/DRESSINGS) ×1 IMPLANT
BNDG COMPR ESMARK 4X3 LF (GAUZE/BANDAGES/DRESSINGS) IMPLANT
CHLORAPREP W/TINT 26 (MISCELLANEOUS) ×1 IMPLANT
CORD BIPOLAR FORCEPS 12FT (ELECTRODE) ×1 IMPLANT
COVER BACK TABLE 60X90IN (DRAPES) ×1 IMPLANT
COVER MAYO STAND STRL (DRAPES) ×1 IMPLANT
CUFF TOURN SGL QUICK 18X4 (TOURNIQUET CUFF) ×1 IMPLANT
DRAPE EXTREMITY T 121X128X90 (DISPOSABLE) ×1 IMPLANT
DRAPE SURG 17X23 STRL (DRAPES) ×1 IMPLANT
GAUZE SPONGE 4X4 12PLY STRL (GAUZE/BANDAGES/DRESSINGS) ×1 IMPLANT
GAUZE XEROFORM 1X8 LF (GAUZE/BANDAGES/DRESSINGS) ×1 IMPLANT
GLOVE BIO SURGEON STRL SZ7.5 (GLOVE) ×1 IMPLANT
GLOVE BIOGEL PI IND STRL 8 (GLOVE) ×1 IMPLANT
GOWN STRL REUS W/ TWL LRG LVL3 (GOWN DISPOSABLE) ×1 IMPLANT
GOWN STRL REUS W/TWL XL LVL3 (GOWN DISPOSABLE) ×1 IMPLANT
NDL HYPO 25X1 1.5 SAFETY (NEEDLE) ×1 IMPLANT
NEEDLE HYPO 25X1 1.5 SAFETY (NEEDLE) ×1 IMPLANT
NS IRRIG 1000ML POUR BTL (IV SOLUTION) ×1 IMPLANT
PACK BASIN DAY SURGERY FS (CUSTOM PROCEDURE TRAY) ×1 IMPLANT
STOCKINETTE 4X48 STRL (DRAPES) ×1 IMPLANT
SUT ETHILON 4 0 PS 2 18 (SUTURE) ×1 IMPLANT
SYR BULB EAR ULCER 3OZ GRN STR (SYRINGE) ×1 IMPLANT
SYR CONTROL 10ML LL (SYRINGE) ×1 IMPLANT
TOWEL GREEN STERILE FF (TOWEL DISPOSABLE) ×2 IMPLANT
UNDERPAD 30X36 HEAVY ABSORB (UNDERPADS AND DIAPERS) ×1 IMPLANT

## 2023-12-28 NOTE — Anesthesia Procedure Notes (Signed)
 Procedure Name: LMA Insertion Date/Time: 12/28/2023 1:39 PM  Performed by: Julieanne Fairy BROCKS, CRNAPre-anesthesia Checklist: Patient identified, Emergency Drugs available, Suction available and Patient being monitored Patient Re-evaluated:Patient Re-evaluated prior to induction Oxygen Delivery Method: Circle system utilized Preoxygenation: Pre-oxygenation with 100% oxygen Induction Type: IV induction Ventilation: Mask ventilation without difficulty LMA: LMA inserted LMA Size: 4.0 Number of attempts: 1 Airway Equipment and Method: Bite block Placement Confirmation: positive ETCO2 Tube secured with: Tape Dental Injury: Teeth and Oropharynx as per pre-operative assessment

## 2023-12-28 NOTE — Anesthesia Preprocedure Evaluation (Addendum)
 Anesthesia Evaluation  Patient identified by MRN, date of birth, ID band Patient awake    Reviewed: Allergy & Precautions, NPO status , Patient's Chart, lab work & pertinent test results  History of Anesthesia Complications Negative for: history of anesthetic complications  Airway Mallampati: III  TM Distance: >3 FB Neck ROM: Full    Dental no notable dental hx.    Pulmonary neg pulmonary ROS   Pulmonary exam normal        Cardiovascular hypertension, Pt. on medications Normal cardiovascular exam     Neuro/Psych negative neurological ROS     GI/Hepatic Neg liver ROS,GERD  Controlled,,  Endo/Other  diabetes, Type 2, Oral Hypoglycemic AgentsHypothyroidism    Renal/GU negative Renal ROS     Musculoskeletal   Abdominal   Peds  Hematology  (+) Blood dyscrasia, anemia   Anesthesia Other Findings Day of surgery medications reviewed with patient.  Reproductive/Obstetrics                              Anesthesia Physical Anesthesia Plan  ASA: 2  Anesthesia Plan: General   Post-op Pain Management: Tylenol  PO (pre-op)*   Induction: Intravenous  PONV Risk Score and Plan: 3 and Treatment may vary due to age or medical condition, Ondansetron , Dexamethasone  and Midazolam   Airway Management Planned: LMA  Additional Equipment: None  Intra-op Plan:   Post-operative Plan: Extubation in OR  Informed Consent: I have reviewed the patients History and Physical, chart, labs and discussed the procedure including the risks, benefits and alternatives for the proposed anesthesia with the patient or authorized representative who has indicated his/her understanding and acceptance.     Dental advisory given and Interpreter used for interview  Plan Discussed with: CRNA  Anesthesia Plan Comments:          Anesthesia Quick Evaluation

## 2023-12-28 NOTE — Discharge Instructions (Addendum)
 Hand Center Instructions Hand Surgery  Wound Care: Keep your hand elevated above the level of your heart.  Do not allow it to dangle by your side.  Keep the dressing dry and do not remove it unless your doctor advises you to do so.  He will usually change it at the time of your post-op visit.  Moving your fingers is advised to stimulate circulation but will depend on the site of your surgery.  If you have a splint applied, your doctor will advise you regarding movement.  Activity: Do not drive or operate machinery today.  Rest today and then you may return to your normal activity and work as indicated by your physician.  Diet:  Drink liquids today or eat a light diet.  You may resume a regular diet tomorrow.    General expectations: Pain for two to three days. Fingers may become slightly swollen.  Call your doctor if any of the following occur: Severe pain not relieved by pain medication. Elevated temperature. Dressing soaked with blood. Inability to move fingers. White or bluish color to fingers.   Last dose of Tylenol  was at 11:40am today. Next dose will be at 5:40pm, if needed.    Post Anesthesia Home Care Instructions  Activity: Get plenty of rest for the remainder of the day. A responsible individual must stay with you for 24 hours following the procedure.  For the next 24 hours, DO NOT: -Drive a car -Advertising copywriter -Drink alcoholic beverages -Take any medication unless instructed by your physician -Make any legal decisions or sign important papers.  Meals: Start with liquid foods such as gelatin or soup. Progress to regular foods as tolerated. Avoid greasy, spicy, heavy foods. If nausea and/or vomiting occur, drink only clear liquids until the nausea and/or vomiting subsides. Call your physician if vomiting continues.  Special Instructions/Symptoms: Your throat may feel dry or sore from the anesthesia or the breathing tube placed in your throat during surgery. If  this causes discomfort, gargle with warm salt water. The discomfort should disappear within 24 hours.  If you had a scopolamine patch placed behind your ear for the management of post- operative nausea and/or vomiting:  1. The medication in the patch is effective for 72 hours, after which it should be removed.  Wrap patch in a tissue and discard in the trash. Wash hands thoroughly with soap and water. 2. You may remove the patch earlier than 72 hours if you experience unpleasant side effects which may include dry mouth, dizziness or visual disturbances. 3. Avoid touching the patch. Wash your hands with soap and water after contact with the patch.

## 2023-12-28 NOTE — Transfer of Care (Signed)
 Immediate Anesthesia Transfer of Care Note  Patient: Jamie [name]  Procedure(s) Performed: RELEASE, A1 PULLEY, FOR TRIGGER FINGER (Right: Ring Finger)  Patient Location: PACU  Anesthesia Type:General  Level of Consciousness: sedated  Airway & Oxygen Therapy: Patient Spontanous Breathing and Patient connected to face mask oxygen  Post-op Assessment: Report given to RN and Post -op Vital signs reviewed and stable  Post vital signs: Reviewed and stable  Last Vitals:  Vitals Value Taken Time  BP 103/59 12/28/23 14:05  Temp 36.2 C 12/28/23 14:05  Pulse 63 12/28/23 14:07  Resp 14 12/28/23 14:07  SpO2 99 % 12/28/23 14:07  Vitals shown include unfiled device data.  Last Pain:  Vitals:   12/28/23 1139  TempSrc: Temporal  PainSc: 0-No pain      Patients Stated Pain Goal: 3 (12/28/23 1139)  Complications: No notable events documented.

## 2023-12-28 NOTE — H&P (Signed)
 Jamie [name] is an 58 y.o. female.   Chief Complaint: trigger digit HPI: 58 yo female with right ring finger trigger digit.  This has been injected without lasting relief.  She wishes to proceed with surgical release of right ring finger trigger digit.  Allergies: No Known Allergies  Past Medical History:  Diagnosis Date   Blood type, Rh negative 06/26/2010   Diabetes mellitus without complication (HCC)    Herpes simplex without mention of complication    HTN (hypertension)    Hypercholesteremia    Hypothyroidism    Thyroid  disease    hypothyroid    Past Surgical History:  Procedure Laterality Date   CESAREAN SECTION     DIAGNOSTIC LAPAROSCOPY     TUBAL LIGATION      Family History: Family History  Problem Relation Age of Onset   Heart disease Father    Diabetes Sister    Diabetes Sister    Breast cancer Neg Hx     Social History:   reports that she has never smoked. She has never used smokeless tobacco. She reports that she does not drink alcohol and does not use drugs.  Medications: Medications Prior to Admission  Medication Sig Dispense Refill   atorvastatin  (LIPITOR) 40 MG tablet Take 1 tablet (40 mg total) by mouth daily. 90 tablet 1   hydrochlorothiazide  (HYDRODIURIL ) 25 MG tablet Take 25 mg by mouth every morning.     levothyroxine  (SYNTHROID , LEVOTHROID) 100 MCG tablet Take 1 tablet (100 mcg total) by mouth daily. 30 tablet 6   metFORMIN (GLUCOPHAGE) 850 MG tablet Take 850 mg by mouth 2 (two) times daily.     valACYclovir (VALTREX) 1000 MG tablet Take 1,000 mg by mouth 3 (three) times daily.      Results for orders placed or performed during the hospital encounter of 12/28/23 (from the past 48 hours)  Glucose, capillary     Status: Abnormal   Collection Time: 12/28/23 11:45 AM  Result Value Ref Range   Glucose-Capillary 119 (H) 70 - 99 mg/dL    Comment: Glucose reference range applies only to samples taken after fasting for at least 8 hours.     No results found.    Blood pressure (!) 151/73, pulse 70, temperature (!) 97 F (36.1 C), temperature source Temporal, resp. rate 20, height 5' 3 (1.6 m), weight 74 kg, last menstrual period 01/04/2018, SpO2 100%.  General appearance: alert, cooperative, and appears stated age Head: Normocephalic, without obvious abnormality, atraumatic Neck: supple, symmetrical, trachea midline Extremities: Intact sensation and capillary refill all digits.  +epl/fpl/io.  No wounds.  Skin: Skin color, texture, turgor normal. No rashes or lesions Neurologic: Grossly normal Incision/Wound: none  Assessment/Plan Right ring finger trigger digit.  Non operative and operative treatment options have been discussed with the patient and patient wishes to proceed with operative treatment. Risks, benefits, and alternatives of surgery have been discussed and the patient agrees with the plan of care.   Cort Dragoo 12/28/2023, 1:16 PM

## 2023-12-28 NOTE — Op Note (Signed)
 12/28/2023 [facility]  Operative Note  PREOPERATIVE DIAGNOSIS: RIGHT RING FINGER TRIGGER DIGIT  POSTOPERATIVE DIAGNOSIS:  RIGHT RING FINGER TRIGGER DIGIT  PROCEDURE: Procedure(s): RELEASE, A1 PULLEY, FOR TRIGGER FINGER   SURGEON:  Franky Curia, MD  ASSISTANT:  none.  ANESTHESIA:  General.  IV FLUIDS:  Per anesthesia flow sheet.  ESTIMATED BLOOD LOSS:  Minimal.  COMPLICATIONS:  None.  SPECIMENS:  None.  TOURNIQUET TIME:  Total Tourniquet Time Documented: Upper Arm (Right) - 9 minutes Total: Upper Arm (Right) - 9 minutes   DISPOSITION:  Stable to PACU.  LOCATION: [facility]  INDICATIONS: Jamie [name] is a 58 y.o. female with triggering of the ring finger.  This has been injected without lasting resolution.  She wishes to proceed with surgical trigger release.  Risks, benefits and alternatives of surgery were discussed including the risk of blood loss, infection, damage to nerves, vessels, tendons, ligaments, bone, failure of surgery, need for additional surgery, complications with wound healing, continued pain, continued triggering and need for repeat surgery.  She voiced understanding of these risks and elected to proceed.  OPERATIVE COURSE:  After being identified preoperatively by myself, the patient and I agreed upon the procedure and site of procedure.  The surgical site was marked. Surgical consent had been signed. She was given IV Ancef  as preoperative antibiotic prophylaxis. She was transported to the operating room and placed on the operating room table in supine position with the Right upper extremity on an arm board. General anesthesia was induced by the anesthesiologist.  The Right upper extremity was prepped and draped in normal sterile orthopedic fashion. A surgical pause was performed between surgeons, anesthesia, and operating room staff, and all were in agreement as to the patient, procedure, and site of procedure.  Tourniquet  at the proximal aspect of the extremity was inflated to 250 mmHg after exsanguination of the arm with an Esmarch bandage.  An incision was made at the volar aspect of the MP joint of the ring finger.  This was carried into the subcutaneous tissues by spreading technique.  Bipolar electrocautery was used to obtain hemostasis.  The radial and ulnar digital nerves were protected throughout the case. The flexor sheath was identified.  The A1 pulley was identified and sharply incised.  It was released in its entirety.  The proximal 1-2 mm of the A2 pulley was vented to allow better excursion of the tendons.  The finger was placed through a range of motion and there was noted to be no catching.  The tendons were brought through the wound and any adherences released.  The wound was then copiously irrigated with sterile saline. It was closed with 4-0 nylon in a horizontal mattress fashion.  It was injected with 0.25% plain Marcaine  to aid in postoperative analgesia.  It was dressed with sterile Xeroform, 4x4s, and wrapped lightly with a Coban dressing.  Tourniquet was deflated at 9 minutes.  The fingertips were pink with brisk capillary refill after deflation of the tourniquet.  The operative drapes were broken down and the patient was awoken from anesthesia safely.  She was transferred back to the stretcher and taken to the PACU in stable condition.   I will see her back in the office in 1 week for postoperative followup.  I will give her a prescription for Norco 5/325 1 tab PO q6 hours prn pain, dispense #15.    Takiyah Bohnsack, MD Electronically signed, 12/28/23

## 2023-12-28 NOTE — Anesthesia Postprocedure Evaluation (Signed)
 Anesthesia Post Note  Patient: Jamie [name]  Procedure(s) Performed: RELEASE, A1 PULLEY, FOR TRIGGER FINGER (Right: Ring Finger)     Patient location during evaluation: PACU Anesthesia Type: General Level of consciousness: awake and alert Pain management: pain level controlled Vital Signs Assessment: post-procedure vital signs reviewed and stable Respiratory status: spontaneous breathing, nonlabored ventilation and respiratory function stable Cardiovascular status: blood pressure returned to baseline Postop Assessment: no apparent nausea or vomiting Anesthetic complications: no   No notable events documented.  Last Vitals:  Vitals:   12/28/23 1430 12/28/23 1440  BP: (!) 140/69 (!) 140/82  Pulse: 72 65  Resp: (!) 9 12  Temp:  (!) 36.2 C  SpO2: 98% 99%    Last Pain:  Vitals:   12/28/23 1440  TempSrc:   PainSc: 0-No pain                 Vertell Row

## 2023-12-29 ENCOUNTER — Encounter (HOSPITAL_BASED_OUTPATIENT_CLINIC_OR_DEPARTMENT_OTHER): Payer: Self-pay | Admitting: Orthopedic Surgery

## 2024-03-09 ENCOUNTER — Ambulatory Visit

## 2024-03-09 DIAGNOSIS — Z23 Encounter for immunization: Secondary | ICD-10-CM
